# Patient Record
Sex: Female | Born: 1955 | Race: White | Hispanic: No | Marital: Married | Smoking: Current every day smoker
Health system: Southern US, Community
[De-identification: ages and names within clinical notes are randomized; demographics above are authoritative.]

## PROBLEM LIST (undated history)

## (undated) DIAGNOSIS — E785 Hyperlipidemia, unspecified: Secondary | ICD-10-CM

## (undated) DIAGNOSIS — F1010 Alcohol abuse, uncomplicated: Secondary | ICD-10-CM

## (undated) DIAGNOSIS — I4719 Other supraventricular tachycardia: Secondary | ICD-10-CM

## (undated) DIAGNOSIS — Z720 Tobacco use: Secondary | ICD-10-CM

## (undated) DIAGNOSIS — M109 Gout, unspecified: Secondary | ICD-10-CM

## (undated) DIAGNOSIS — I2510 Atherosclerotic heart disease of native coronary artery without angina pectoris: Secondary | ICD-10-CM

## (undated) DIAGNOSIS — I471 Supraventricular tachycardia, unspecified: Secondary | ICD-10-CM

## (undated) DIAGNOSIS — M1990 Unspecified osteoarthritis, unspecified site: Secondary | ICD-10-CM

## (undated) DIAGNOSIS — R7989 Other specified abnormal findings of blood chemistry: Secondary | ICD-10-CM

## (undated) HISTORY — DX: Supraventricular tachycardia, unspecified: I47.10

## (undated) HISTORY — PX: TUBAL LIGATION: SHX77

## (undated) HISTORY — DX: Supraventricular tachycardia: I47.1

## (undated) HISTORY — DX: Tobacco use: Z72.0

## (undated) HISTORY — DX: Other specified abnormal findings of blood chemistry: R79.89

## (undated) HISTORY — DX: Other supraventricular tachycardia: I47.19

## (undated) HISTORY — DX: Atherosclerotic heart disease of native coronary artery without angina pectoris: I25.10

## (undated) HISTORY — DX: Hyperlipidemia, unspecified: E78.5

## (undated) HISTORY — DX: Alcohol abuse, uncomplicated: F10.10

---

## 2004-09-13 ENCOUNTER — Emergency Department: Payer: Self-pay | Admitting: Emergency Medicine

## 2006-07-07 ENCOUNTER — Emergency Department: Payer: Self-pay | Admitting: Emergency Medicine

## 2009-09-07 ENCOUNTER — Ambulatory Visit: Payer: Self-pay | Admitting: Family Medicine

## 2009-09-07 DIAGNOSIS — Z860100 Personal history of colon polyps, unspecified: Secondary | ICD-10-CM | POA: Insufficient documentation

## 2009-09-07 DIAGNOSIS — M129 Arthropathy, unspecified: Secondary | ICD-10-CM | POA: Insufficient documentation

## 2009-09-07 DIAGNOSIS — K921 Melena: Secondary | ICD-10-CM

## 2009-09-07 DIAGNOSIS — M109 Gout, unspecified: Secondary | ICD-10-CM

## 2009-09-07 DIAGNOSIS — R1031 Right lower quadrant pain: Secondary | ICD-10-CM

## 2009-09-10 ENCOUNTER — Ambulatory Visit: Payer: Self-pay | Admitting: Family Medicine

## 2009-09-10 DIAGNOSIS — E785 Hyperlipidemia, unspecified: Secondary | ICD-10-CM | POA: Insufficient documentation

## 2009-09-10 LAB — CONVERTED CEMR LAB
AST: 28 units/L (ref 0–37)
Albumin: 4.1 g/dL (ref 3.5–5.2)
Alkaline Phosphatase: 101 units/L (ref 39–117)
Chloride: 104 meq/L (ref 96–112)
Cholesterol: 254 mg/dL — ABNORMAL HIGH (ref 0–200)
Eosinophils Relative: 1.9 % (ref 0.0–5.0)
GFR calc non Af Amer: 80.42 mL/min (ref >60)
Glucose, Bld: 110 mg/dL — ABNORMAL HIGH (ref 70–99)
HCT: 42.5 % (ref 36.0–46.0)
HDL: 47.4 mg/dL (ref >39.00)
Hemoglobin: 14.8 g/dL (ref 12.0–15.0)
Lymphs Abs: 3.5 10*3/uL (ref 0.7–4.0)
MCV: 93.9 fL (ref 78.0–100.0)
Monocytes Relative: 8.8 % (ref 3.0–12.0)
Neutro Abs: 8.5 10*3/uL — ABNORMAL HIGH (ref 1.4–7.7)
Potassium: 3.8 meq/L (ref 3.5–5.1)
RDW: 12.5 % (ref 11.5–14.6)
Rhuematoid fact SerPl-aCnc: <20 intl units/mL (ref 0.0–20.0)
Total CHOL/HDL Ratio: 5
Total Protein: 7.5 g/dL (ref 6.0–8.3)
Triglycerides: 431 mg/dL — ABNORMAL HIGH (ref 0.0–149.0)
WBC: 13.6 10*3/uL — ABNORMAL HIGH (ref 4.5–10.5)

## 2009-09-21 ENCOUNTER — Telehealth: Payer: Self-pay | Admitting: Family Medicine

## 2009-09-24 ENCOUNTER — Other Ambulatory Visit: Admission: RE | Admit: 2009-09-24 | Discharge: 2009-09-24 | Payer: Self-pay | Admitting: Family Medicine

## 2009-09-24 ENCOUNTER — Encounter: Payer: Self-pay | Admitting: Family Medicine

## 2009-09-24 ENCOUNTER — Ambulatory Visit: Payer: Self-pay | Admitting: Family Medicine

## 2009-09-25 LAB — CONVERTED CEMR LAB
Albumin: 4.2 g/dL (ref 3.5–5.2)
Cholesterol: 249 mg/dL — ABNORMAL HIGH (ref 0–200)
Total Bilirubin: 0.7 mg/dL (ref 0.3–1.2)
Total CHOL/HDL Ratio: 5
Triglycerides: 311 mg/dL — ABNORMAL HIGH (ref 0.0–149.0)
VLDL: 62.2 mg/dL — ABNORMAL HIGH (ref 0.0–40.0)

## 2009-09-26 ENCOUNTER — Encounter (INDEPENDENT_AMBULATORY_CARE_PROVIDER_SITE_OTHER): Payer: Self-pay | Admitting: *Deleted

## 2009-09-27 ENCOUNTER — Encounter (INDEPENDENT_AMBULATORY_CARE_PROVIDER_SITE_OTHER): Payer: Self-pay | Admitting: *Deleted

## 2009-10-15 ENCOUNTER — Telehealth: Payer: Self-pay | Admitting: Family Medicine

## 2009-11-02 ENCOUNTER — Telehealth: Payer: Self-pay | Admitting: Family Medicine

## 2009-11-19 ENCOUNTER — Telehealth: Payer: Self-pay | Admitting: Family Medicine

## 2009-11-19 ENCOUNTER — Encounter: Admission: RE | Admit: 2009-11-19 | Discharge: 2009-11-19 | Payer: Self-pay | Admitting: Family Medicine

## 2009-11-20 ENCOUNTER — Ambulatory Visit: Payer: Self-pay | Admitting: Gastroenterology

## 2009-11-21 LAB — CONVERTED CEMR LAB
AST: 30 units/L (ref 0–37)
Alkaline Phosphatase: 105 units/L (ref 39–117)
BUN: 10 mg/dL (ref 6–23)
Basophils Absolute: 0.1 10*3/uL (ref 0.0–0.1)
Chloride: 104 meq/L (ref 96–112)
Eosinophils Absolute: 0.2 10*3/uL (ref 0.0–0.7)
Glucose, Bld: 98 mg/dL (ref 70–99)
Hemoglobin: 15 g/dL (ref 12.0–15.0)
Lymphocytes Relative: 21.6 % (ref 12.0–46.0)
MCHC: 33.7 g/dL (ref 30.0–36.0)
Neutrophils Relative %: 69.5 % (ref 43.0–77.0)
Platelets: 354 10*3/uL (ref 150.0–400.0)
Potassium: 5 meq/L (ref 3.5–5.1)
RBC: 4.7 M/uL (ref 3.87–5.11)
RDW: 12.4 % (ref 11.5–14.6)
Sed Rate: 17 mm/hr (ref 0–22)
Sodium: 140 meq/L (ref 135–145)
Total Bilirubin: 0.7 mg/dL (ref 0.3–1.2)

## 2009-12-04 ENCOUNTER — Encounter: Payer: Self-pay | Admitting: Family Medicine

## 2009-12-05 ENCOUNTER — Telehealth: Payer: Self-pay | Admitting: Family Medicine

## 2009-12-07 ENCOUNTER — Ambulatory Visit: Payer: Self-pay | Admitting: Gastroenterology

## 2009-12-11 ENCOUNTER — Encounter: Payer: Self-pay | Admitting: Gastroenterology

## 2009-12-19 ENCOUNTER — Telehealth: Payer: Self-pay | Admitting: Family Medicine

## 2009-12-19 ENCOUNTER — Encounter: Admission: RE | Admit: 2009-12-19 | Discharge: 2009-12-19 | Payer: Self-pay | Admitting: Family Medicine

## 2009-12-20 DIAGNOSIS — N63 Unspecified lump in unspecified breast: Secondary | ICD-10-CM

## 2010-01-03 ENCOUNTER — Telehealth: Payer: Self-pay | Admitting: Family Medicine

## 2010-01-08 ENCOUNTER — Ambulatory Visit: Payer: Self-pay | Admitting: Family Medicine

## 2010-01-10 LAB — CONVERTED CEMR LAB
AST: 27 units/L (ref 0–37)
Direct LDL: 154 mg/dL
HDL: 54.3 mg/dL (ref >39.00)
Total CHOL/HDL Ratio: 4
VLDL: 68 mg/dL — ABNORMAL HIGH (ref 0.0–40.0)

## 2010-01-24 ENCOUNTER — Telehealth: Payer: Self-pay | Admitting: Family Medicine

## 2010-02-14 ENCOUNTER — Telehealth: Payer: Self-pay | Admitting: Family Medicine

## 2010-03-04 ENCOUNTER — Telehealth: Payer: Self-pay | Admitting: Family Medicine

## 2010-03-25 ENCOUNTER — Telehealth: Payer: Self-pay | Admitting: Family Medicine

## 2010-04-29 ENCOUNTER — Telehealth: Payer: Self-pay | Admitting: Family Medicine

## 2010-05-21 ENCOUNTER — Telehealth: Payer: Self-pay | Admitting: Family Medicine

## 2010-06-10 ENCOUNTER — Telehealth: Payer: Self-pay | Admitting: Family Medicine

## 2010-06-27 ENCOUNTER — Telehealth: Payer: Self-pay | Admitting: Family Medicine

## 2010-07-29 ENCOUNTER — Telehealth: Payer: Self-pay | Admitting: Family Medicine

## 2010-08-26 ENCOUNTER — Telehealth: Payer: Self-pay | Admitting: Family Medicine

## 2010-09-17 ENCOUNTER — Telehealth: Payer: Self-pay | Admitting: Family Medicine

## 2010-09-30 ENCOUNTER — Telehealth: Payer: Self-pay | Admitting: Family Medicine

## 2010-12-08 ENCOUNTER — Encounter: Payer: Self-pay | Admitting: Family Medicine

## 2010-12-11 ENCOUNTER — Telehealth: Payer: Self-pay | Admitting: Family Medicine

## 2010-12-17 NOTE — Progress Notes (Signed)
Summary: Hydrocodone/APAP  Phone Note Refill Request Message from:  Fax from Pharmacy on November 19, 2009 4:44 PM  Refills Requested: Medication #1:  HYDROCODONE-ACETAMINOPHEN 5-325 MG TABS 1 by mouth up to 4 times per day as needed for pain [facility]  Phone:   [phone]   Method Requested: Telephone to Pharmacy Initial call taken by: Delilah Shan CMA (AAMA),  November 19, 2009 4:44 PM  Follow-up for Phone Call        OK to call in 30 tabs, no refills. Follow-up by: Ruthe Mannan MD,  November 20, 2009 6:53 AM  Additional Follow-up for Phone Call Additional follow up Details #1::        Medication phoned to pharmacy.  Additional Follow-up by: Delilah Shan CMA (AAMA),  November 20, 2009 8:19 AM    Prescriptions: HYDROCODONE-ACETAMINOPHEN 5-325 MG TABS (HYDROCODONE-ACETAMINOPHEN) 1 by mouth up to 4 times per day as needed for pain  #30 x 0   Entered by:   Delilah Shan CMA (AAMA)   Authorized by:   Ruthe Mannan MD   Signed by:   Delilah Shan CMA (AAMA) on 11/20/2009   Method used:   Handwritten   RxID:   [id number]

## 2010-12-17 NOTE — Progress Notes (Signed)
Summary: refill request for vicodin  Phone Note Refill Request Message from:  Fax from Pharmacy  Refills Requested: Medication #1:  HYDROCODONE-ACETAMINOPHEN 5-325 MG TABS 1 by mouth up to 4 times per day as needed for pain   Last Refilled: 01/24/2010 Faxed request from [place], [phone].  Initial call taken by: Lowella Petties CMA,  February 14, 2010 9:22 AM  Follow-up for Phone Call        Medication phoned to pharmacy.  Follow-up by: Delilah Shan CMA Duncan Dull),  February 14, 2010 11:15 AM    Prescriptions: HYDROCODONE-ACETAMINOPHEN 5-325 MG TABS (HYDROCODONE-ACETAMINOPHEN) 1 by mouth up to 4 times per day as needed for pain  #30 x 0   Entered and Authorized by:   Ruthe Mannan MD   Signed by:   Ruthe Mannan MD on 02/14/2010   Method used:   Handwritten   RxID:   [id number]

## 2010-12-17 NOTE — Progress Notes (Signed)
Summary: Rx Hydrocodone  Phone Note Refill Request Call back at [phone] Message from:  [facility] on January 03, 2010 3:31 PM  Refills Requested: Medication #1:  HYDROCODONE-ACETAMINOPHEN 5-325 MG TABS 1 by mouth up to 4 times per day as needed for pain   Last Refilled: 12/19/2009 Received faxed refill request, please advise.   Method Requested: Telephone to Pharmacy Initial call taken by: Linde Gillis CMA Duncan Dull),  January 03, 2010 3:31 PM  Follow-up for Phone Call        On my desk. Follow-up by: Ruthe Mannan MD,  January 03, 2010 3:36 PM  Additional Follow-up for Phone Call Additional follow up Details #1::        Medication phoned to pharmacy.  Additional Follow-up by: Delilah Shan CMA Duncan Dull),  January 03, 2010 3:38 PM    Prescriptions: HYDROCODONE-ACETAMINOPHEN 5-325 MG TABS (HYDROCODONE-ACETAMINOPHEN) 1 by mouth up to 4 times per day as needed for pain  #30 x 0   Entered and Authorized by:   Ruthe Mannan MD   Signed by:   Ruthe Mannan MD on 01/03/2010   Method used:   Print then Give to Patient   RxID:   [id number]

## 2010-12-17 NOTE — Progress Notes (Signed)
Summary: Rx Hydrocodone/APAP  Phone Note Refill Request Call back at [phone] Message from:  [facility] on August 26, 2010 9:24 AM  Refills Requested: Medication #1:  HYDROCODONE-ACETAMINOPHEN 5-325 MG TABS 1 by mouth up to 4 times per day as needed for pain   Last Refilled: 07/29/2010 Received faxed refill request please advise.   Method Requested: Telephone to Pharmacy Initial call taken by: Linde Gillis CMA Duncan Dull),  August 26, 2010 9:25 AM  Follow-up for Phone Call        Rx called to pharmacy Follow-up by: Linde Gillis CMA Duncan Dull),  August 26, 2010 10:55 AM    Prescriptions: HYDROCODONE-ACETAMINOPHEN 5-325 MG TABS (HYDROCODONE-ACETAMINOPHEN) 1 by mouth up to 4 times per day as needed for pain  #60 x 0   Entered and Authorized by:   Ruthe Mannan MD   Signed by:   Ruthe Mannan MD on 08/26/2010   Method used:   Telephoned to ...       [facility]* (retail)       [address]       [place], [place]  [address]       Ph: [phone]       Fax: [phone]   RxID:   [id number]

## 2010-12-17 NOTE — Letter (Signed)
Summary: [facility] Instructions  [facility]  [address] [place], [place] [address]   Phone: [phone]  Fax: [phone]       JEREMIE ABDELAZIZ    December 23, 1955    MRN: [id number]        Procedure Day /Date:11/28/2009     Arrival Time:830 am     Procedure Time:930 am     Location of Procedure:                    [facility] ([facility]th [facility])   PREPARATION FOR COLONOSCOPY WITH MOVIPREP   Starting 5 days prior to your procedure 11/23/2009 do not eat nuts, seeds, popcorn, corn, beans, peas,  salads, or any raw vegetables.  Do not take any fiber supplements (e.g. Metamucil, Citrucel, and Benefiber).  THE DAY BEFORE YOUR PROCEDURE         DATE: 11/27/2009  DAY: TUE  1.  Drink clear liquids the entire day-NO SOLID FOOD  2.  Do not drink anything colored red or purple.  Avoid juices with pulp.  No orange juice.  3.  Drink at least 64 oz. (8 glasses) of fluid/clear liquids during the day to prevent dehydration and help the prep work efficiently.  CLEAR LIQUIDS INCLUDE: Water Jello Ice Popsicles Tea (sugar ok, no milk/cream) Powdered fruit flavored drinks Coffee (sugar ok, no milk/cream) Gatorade Juice: apple, white grape, white cranberry  Lemonade Clear bullion, consomm, broth Carbonated beverages (any kind) Strained chicken noodle soup Hard Candy                             4.  In the morning, mix first dose of MoviPrep solution:    Empty 1 Pouch A and 1 Pouch B into the disposable container    Add lukewarm drinking water to the top line of the container. Mix to dissolve    Refrigerate (mixed solution should be used within 24 hrs)  5.  Begin drinking the prep at 5:00 p.m. The MoviPrep container is divided by 4 marks.   Every 15 minutes drink the solution down to the next mark (approximately 8 oz) until the full liter is complete.   6.  Follow completed prep with 16 oz of clear liquid of your choice (Nothing red or purple).  Continue to drink  clear liquids until bedtime.  7.  Before going to bed, mix second dose of MoviPrep solution:    Empty 1 Pouch A and 1 Pouch B into the disposable container    Add lukewarm drinking water to the top line of the container. Mix to dissolve    Refrigerate  THE DAY OF YOUR PROCEDURE      DATE: 11/28/09 DAY: WED  Beginning at 430 a.m. (5 hours before procedure):         1. Every 15 minutes, drink the solution down to the next mark (approx 8 oz) until the full liter is complete.  2. Follow completed prep with 16 oz. of clear liquid of your choice.    3. You may drink clear liquids until 730 am (2 HOURS BEFORE PROCEDURE).   MEDICATION INSTRUCTIONS  Unless otherwise instructed, you should take regular prescription medications with a small sip of water   as early as possible the morning of your procedure.  Diabetic patients - see separate instructions.           OTHER INSTRUCTIONS  You will need a  responsible adult at least 53 years of age to accompany you and drive you home.   This person must remain in the waiting room during your procedure.  Wear loose fitting clothing that is easily removed.  Leave jewelry and other valuables at home.  However, you may wish to bring a book to read or  an iPod/MP3 player to listen to music as you wait for your procedure to start.  Remove all body piercing jewelry and leave at home.  Total time from sign-in until discharge is approximately 2-3 hours.  You should go home directly after your procedure and rest.  You can resume normal activities the  day after your procedure.  The day of your procedure you should not:   Drive   Make legal decisions   Operate machinery   Drink alcohol   Return to work  You will receive specific instructions about eating, activities and medications before you leave.    The above instructions have been reviewed and explained to me by   _______________________    I fully understand and can  verbalize these instructions _____________________________ Date _________

## 2010-12-17 NOTE — Progress Notes (Signed)
Summary: refill request for vicodin  Phone Note Refill Request Message from:  Fax from Pharmacy  Refills Requested: Medication #1:  HYDROCODONE-ACETAMINOPHEN 5-325 MG TABS 1 by mouth up to 4 times per day as needed for pain   Last Refilled: 02/14/2010 Faxed request from [place], [phone].  Initial call taken by: Lowella Petties CMA,  March 04, 2010 4:05 PM  Follow-up for Phone Call        Rx called to pharmacy Follow-up by: Benny Lennert CMA [facility] Dull),  March 05, 2010 10:50 AM    Prescriptions: HYDROCODONE-ACETAMINOPHEN 5-325 MG TABS (HYDROCODONE-ACETAMINOPHEN) 1 by mouth up to 4 times per day as needed for pain  #30 x 0   Entered and Authorized by:   Ruthe Mannan MD   Signed by:   Ruthe Mannan MD on 03/04/2010   Method used:   Handwritten   RxID:   [id number]

## 2010-12-17 NOTE — Progress Notes (Signed)
Summary: refill request for vicodin  Phone Note Refill Request Message from:  Fax from Pharmacy  Refills Requested: Medication #1:  HYDROCODONE-ACETAMINOPHEN 5-325 MG TABS 1 by mouth up to 4 times per day as needed for pain   Last Refilled: 06/27/2010 Faxed request from [place], [phone].  Initial call taken by: Lowella Petties CMA,  July 29, 2010 2:27 PM  Follow-up for Phone Call        Rx called to pharmacy Follow-up by: Linde Gillis CMA Duncan Dull),  July 29, 2010 3:22 PM    Prescriptions: HYDROCODONE-ACETAMINOPHEN 5-325 MG TABS (HYDROCODONE-ACETAMINOPHEN) 1 by mouth up to 4 times per day as needed for pain  #60 x 0   Entered and Authorized by:   Ruthe Mannan MD   Signed by:   Ruthe Mannan MD on 07/29/2010   Method used:   Telephoned to ...       [facility]* (retail)       [address]       [place], [place]  [address]       Ph: [phone]       Fax: [phone]   RxID:   [id number]

## 2010-12-17 NOTE — Progress Notes (Signed)
Summary: [facility]  Phone Note Refill Request Message from:  Fax from Pharmacy  Refills Requested: Medication #1:  HYDROCODONE-ACETAMINOPHEN 5-325 MG TABS 1 by mouth up to 4 times per day as needed for pain   Last Refilled: 03/05/2010 Faxed request from [place], [phone].  Initial call taken by: Lowella Petties CMA,  Mar 25, 2010 3:36 PM  Follow-up for Phone Call        ok to give 60 with no refills. Ruthe Mannan MD  Mar 25, 2010 3:46 PM Medication phoned to pharmacy. Lugene Fuquay CMA Duncan Dull)  Mar 25, 2010 3:53 PM

## 2010-12-17 NOTE — Procedures (Signed)
Summary: Colonoscopy  Patient: [name] Note: All result statuses are Final unless otherwise noted.  Tests: (1) Colonoscopy (COL)   COL Colonoscopy           DONE     [facility]     [address]     [place], [place]  [address]           COLONOSCOPY PROCEDURE REPORT           PATIENT:  Jennifer Pennington, Jennifer Pennington  MR#:  [id number]     BIRTHDATE:  1956/10/06, 53 yrs. old  GENDER:  female           ENDOSCOPIST:  Rachael Fee, MD     Referred by:  Ruthe Mannan, M.D.           PROCEDURE DATE:  12/07/2009     PROCEDURE:  Colonoscopy with biopsy     ASA CLASS:  Class II     INDICATIONS:  intermittent rectal bleeding (not anemic)           MEDICATIONS:   Fentanyl 125 mcg IV, Versed 12 mg IV           DESCRIPTION OF PROCEDURE:   After the risks benefits and     alternatives of the procedure were thoroughly explained, informed     consent was obtained.  Digital rectal exam was performed and     revealed no rectal masses.   The LB [id number]-[id number] endoscope     was introduced through the anus and advanced to the cecum, which     was identified by both the appendix and ileocecal valve, without     limitations.  The quality of the prep was good, using MoviPrep.     The instrument was then slowly withdrawn as the colon was fully     examined.     <<PROCEDUREIMAGES>>           FINDINGS:  Moderate diverticulosis was found sigmoid to descending     colon segments.  There was minor associated edema throughout these     segments (see image1).  Internal and external hemorrhoids were     found. These were medium sized, not thrombosed (see image4).  This     was otherwise a normal examination of the colon (see image3 and     image2).   Random colon biopsies were taken and sent to pathology     (jar 1).  Retroflexed views in the rectum revealed no     abnormalities.    The scope was then withdrawn from the patient     and the procedure completed.           COMPLICATIONS:  None        ENDOSCOPIC IMPRESSION:     1) Moderate diverticulosis in the sigmoid to descending colon     segments     2) Internal and external hemorrhoids, medium sized     3) Otherwise normal examination; random colon biopsies were     taken to check for microscopic colitis (can cause chronic loose     stools, diarrhea)           RECOMMENDATIONS:     1) Continue current colorectal screening recommendations for     "routine risk" patients with a repeat colonoscopy in 10 years.     2) Hemorrhoids are almost certainly causing the intermittent     rectal bleeding.  She should use OTC preparation H as needed twice  daily when the bleeding symptoms flare up.  If this is not     helpful, call Dr. Christella Hartigan office for prescription strength meds.     3) If biopsies from colon suggest microscopic colitis, then she will     start appropriate meds for this. If not, will advise once daily,     scheduled immodium.           REPEAT EXAM:  10 years           ______________________________     Rachael Fee, MD           n.     eSIGNED:   Rachael Fee at 12/07/2009 10:37 AM           Delorse Limber, [phone]  Note: An exclamation mark (!) indicates a result that was not dispersed into the flowsheet. Document Creation Date: 12/07/2009 10:36 AM _______________________________________________________________________  (1) Order result status: Final Collection or observation date-time: 12/07/2009 10:28 Requested date-time:  Receipt date-time:  Reported date-time:  Referring Physician:   Ordering Physician: Rob Bunting [id number]) Specimen Source:  Source: Launa Grill Order Number: [id number] Lab site:   Appended Document: Colonoscopy     Procedures Next Due Date:    Colonoscopy: 12/2019

## 2010-12-17 NOTE — Progress Notes (Signed)
Summary: Rx Hydrocodone/APAP  Phone Note Refill Request Call back at [phone] Message from:  [facility] on May 21, 2010 2:21 PM  Refills Requested: Medication #1:  HYDROCODONE-ACETAMINOPHEN 5-325 MG TABS 1 by mouth up to 4 times per day as needed for pain   Last Refilled: 04/29/2010 Received faxed refill request please advise.   Method Requested: Telephone to Pharmacy Initial call taken by: Linde Gillis CMA Duncan Dull),  May 21, 2010 2:23 PM  Follow-up for Phone Call        Rx called to pharmacy Follow-up by: Linde Gillis CMA Duncan Dull),  May 21, 2010 3:20 PM    Prescriptions: HYDROCODONE-ACETAMINOPHEN 5-325 MG TABS (HYDROCODONE-ACETAMINOPHEN) 1 by mouth up to 4 times per day as needed for pain  #30 x 0   Entered and Authorized by:   Ruthe Mannan MD   Signed by:   Ruthe Mannan MD on 05/21/2010   Method used:   Telephoned to ...       [facility]* (retail)       [address]       [place], [place]  [address]       Ph: [phone]       Fax: [phone]   RxID:   [id number]

## 2010-12-17 NOTE — Progress Notes (Signed)
Summary: Rx Hydrocodone  Phone Note Refill Request Call back at [phone] Message from:  [facility] on January 24, 2010 11:54 AM  Refills Requested: Medication #1:  HYDROCODONE-ACETAMINOPHEN 5-325 MG TABS 1 by mouth up to 4 times per day as needed for pain   Last Refilled: 01/03/2010 Received faxed refill request, please advise.   Method Requested: Telephone to Pharmacy Initial call taken by: Linde Gillis CMA Duncan Dull),  January 24, 2010 11:54 AM  Follow-up for Phone Call        On my desk. Follow-up by: Ruthe Mannan MD,  January 24, 2010 12:05 PM  Additional Follow-up for Phone Call Additional follow up Details #1::        Medication phoned to pharmacy.   Additional Follow-up by: Delilah Shan CMA Duncan Dull),  January 24, 2010 12:26 PM    Prescriptions: HYDROCODONE-ACETAMINOPHEN 5-325 MG TABS (HYDROCODONE-ACETAMINOPHEN) 1 by mouth up to 4 times per day as needed for pain  #30 x 0   Entered and Authorized by:   Ruthe Mannan MD   Signed by:   Ruthe Mannan MD on 01/24/2010   Method used:   Print then Give to Patient   RxID:   [id number]

## 2010-12-17 NOTE — Progress Notes (Signed)
Summary: pt is changing pharmacies  Phone Note Refill Request Message from:  Fax from Pharmacy  Refills Requested: Medication #1:  HYDROCODONE-ACETAMINOPHEN 5-325 MG TABS 1 by mouth up to 4 times per day as needed for pain  Medication #2:  PRAVASTATIN SODIUM 10 MG  TABS one by mouth at bedtime. Pt has transferred her refills to [facility] in [place], forms are on your desk.  Initial call taken by: Lowella Petties CMA, AAMA,  September 30, 2010 12:49 PM  Follow-up for Phone Call        on my desk. Ruthe Mannan MD  October 01, 2010 7:27 AM Forms faxed.         Lowella Petties CMA, AAMA  October 01, 2010 8:01 AM

## 2010-12-17 NOTE — Progress Notes (Signed)
Summary: refill request for vicodin  Phone Note Refill Request Message from:  Fax from Pharmacy  Refills Requested: Medication #1:  HYDROCODONE-ACETAMINOPHEN 5-325 MG TABS 1 by mouth up to 4 times per day as needed for pain   Last Refilled: 03/25/2010 Faxed request from [place], [phone].  Initial call taken by: Lowella Petties CMA,  April 29, 2010 12:45 PM  Follow-up for Phone Call        Rx called to pharmacy Follow-up by: Linde Gillis CMA Duncan Dull),  April 29, 2010 2:26 PM    Prescriptions: HYDROCODONE-ACETAMINOPHEN 5-325 MG TABS (HYDROCODONE-ACETAMINOPHEN) 1 by mouth up to 4 times per day as needed for pain  #30 x 0   Entered and Authorized by:   Ruthe Mannan MD   Signed by:   Ruthe Mannan MD on 04/29/2010   Method used:   Telephoned to ...       [facility]* (retail)       [address]       [place], [place]  [address]       Ph: [phone]       Fax: [phone]   RxID:   [id number]

## 2010-12-17 NOTE — Progress Notes (Signed)
Summary: Rx Hydrocodone/APAP  Phone Note Refill Request Call back at [phone] Message from:  [facility] on December 05, 2009 12:08 PM  Refills Requested: Medication #1:  HYDROCODONE-ACETAMINOPHEN 5-325 MG TABS 1 by mouth up to 4 times per day as needed for pain   Last Refilled: 11/20/2009 Received faxed refill request, please advise   Method Requested: Telephone to Pharmacy Initial call taken by: Linde Gillis CMA Duncan Dull),  December 05, 2009 12:10 PM  Follow-up for Phone Call        Medication phoned to pharmacy.  Follow-up by: Delilah Shan CMA Duncan Dull),  December 05, 2009 2:38 PM    Prescriptions: HYDROCODONE-ACETAMINOPHEN 5-325 MG TABS (HYDROCODONE-ACETAMINOPHEN) 1 by mouth up to 4 times per day as needed for pain  #30 x 0   Entered and Authorized by:   Ruthe Mannan MD   Signed by:   Ruthe Mannan MD on 12/05/2009   Method used:   Handwritten   RxID:   [id number]

## 2010-12-17 NOTE — Letter (Signed)
Summary: Results Letter  [facility]  [address] [place], [place] [address]   Phone: [phone]  Fax: [phone]        December 11, 2009 MRN: [id number]    Jennifer Pennington [address] [place], [place]  [address]    Dear Ms. Wimes,   The biopsies taken during your recent procedure colonoscopy showed no sign of inflammation or infection that could account for your chronic loose stools.  Please start one over the counter immodium every morning shortly after you wake up to help slow your bowels down.  If this is not helpful, add a second pill later in the morning or early afternoon.  If you are still have difficulty, please call my office to schedule a return appointment.  You should continue to follow current colorectal cancer screening guidelines with a repeat colonoscopy in 10 years.  We will therefore put your information in our reminder system and will contact you in 10 years to schedule a repeat procedure.  Please call if you have any questions or concerns.       Sincerely,  Rachael Fee MD  This letter has been electronically signed by your physician.  Appended Document: Results Letter Letter mailed 1.27.11

## 2010-12-17 NOTE — Progress Notes (Signed)
Summary: refill request for vicodin  Phone Note Refill Request Message from:  Fax from Pharmacy  Refills Requested: Medication #1:  HYDROCODONE-ACETAMINOPHEN 5-325 MG TABS 1 by mouth up to 4 times per day as needed for pain   Last Refilled: 05/21/2010 Faxed request from [place], [phone].   Initial call taken by: Lowella Petties CMA,  June 10, 2010 8:53 AM  Follow-up for Phone Call        Rx called to pharmacy Follow-up by: Linde Gillis CMA Duncan Dull),  June 10, 2010 9:15 AM    Prescriptions: HYDROCODONE-ACETAMINOPHEN 5-325 MG TABS (HYDROCODONE-ACETAMINOPHEN) 1 by mouth up to 4 times per day as needed for pain  #30 x 0   Entered and Authorized by:   Ruthe Mannan MD   Signed by:   Ruthe Mannan MD on 06/10/2010   Method used:   Telephoned to ...       [facility]* (retail)       [address]       [place], [place]  [address]       Ph: [phone]       Fax: [phone]   RxID:   [id number]

## 2010-12-17 NOTE — Assessment & Plan Note (Signed)
History of Present Illness Visit Type: consult  Primary GI MD: Rob Bunting MD Primary Provider: Ruthe Mannan, MD Requesting Provider: Ruthe Mannan, MD Chief Complaint: BRB in stool and when pt wipes after BMs  History of Present Illness:     very pleasant 55 year old woman who noted red blood in her stool for the past 3-4 years.  Last time was about 1 month ago.  She has no anal discomfort around those times.  The bleeding will last 2-7 days, no changes in BMs otherwise.  often times the bleeding will be preceded by low, left pelvic discomfort.  she tells me that she had a colonoscopy about 20 years ago and that polyps were removed at that time. She has not had repeat colonoscopy since then.  her weight is up a bout 15 pounds in past couple years.           Current Medications (verified): 1)  Ibuprofen 200 Mg Tabs (Ibuprofen) .... Otc    Takes 15 - 18 Per Day For Body Aches. 2)  Multivitamins   Tabs (Multiple Vitamin) .... Take 1 Tablet By Mouth Once A Day 3)  Meloxicam 15 Mg Tabs (Meloxicam) .Marland Kitchen.. 1 Tab By Mouth Daily. 4)  Hydrocodone-Acetaminophen 5-325 Mg Tabs (Hydrocodone-Acetaminophen) .Marland Kitchen.. 1 By Mouth Up To 4 Times Per Day As Needed For Pain 5)  Pravastatin Sodium 10 Mg  Tabs (Pravastatin Sodium) .... One By Mouth At Bedtime  Allergies (verified): No Known Drug Allergies  Past History:  Past Medical History: colon polyps, unclear pathology, 1990 Gout G3P3  Past Surgical History: Tubal ligation   Family History: Family History Diabetes 1st degree relative Family History High cholesterol Family History Hypertension grandfather had pancreatic cancer  Social History: Lives with husband and 3 dogs.  Has three adult children that live in the area.  Works in Clinical research associate at [employer].  Smokes 1 1/2 ppd x 35 years.  Drinks 2 beers/day.   Review of Systems       Pertinent positive and negative review of systems were noted in the above HPI and GI specific review of systems.  All  other review of systems was otherwise negative.   Vital Signs:  Patient profile:   55 year old female Height:      61.5 inches Weight:      155 pounds BMI:     28.92 BSA:     1.71 Pulse rate:   86 / minute Pulse rhythm:   regular BP sitting:   120 / 76  (left arm) Cuff size:   regular  Vitals Entered By: Ok Anis CMA (November 20, 2009 2:00 PM)  Physical Exam  Additional Exam:  Constitutional: generally well appearing Psychiatric: alert and oriented times 3 Eyes: extraocular movements intact Mouth: oropharynx moist, no lesions Neck: supple, no lymphadenopathy Cardiovascular: heart regular rate and rythm Lungs: CTA bilaterally Abdomen: soft, non-tender, non-distended, no obvious ascites, no peritoneal signs, normal bowel sounds Extremities: no lower extremity edema bilaterally Skin: no lesions on visible extremities    Impression & Recommendations:  Problem # 1:  Intermittent rectal bleeding, intermittent left lower quadrant discomfort this may be IBD related. I did not mention above but she does generally have 4-5 BMs a day. This is been her pattern for many years. They are not usually diarrheal stools however. She should undergo colonoscopy at her soonest convenience. We will also arrange for CBC, complete metabolic profile to be done.  Other Orders: TLB-CBC Platelet - w/Differential (85025-CBCD) TLB-CMP (Comprehensive Metabolic  Pnl) (80053-COMP) TLB-Sedimentation Rate (ESR) (85652-ESR)  Patient Instructions: 1)  You will be scheduled to have a colonoscopy. 2)  You will get lab test(s) done today (cbc, esr, cmet). 3)  A copy of this information will be sent to Dr. Dayton Martes. 4)  The medication list was reviewed and reconciled.  All changed / newly prescribed medications were explained.  A complete medication list was provided to the patient / caregiver. 5)  The medication list was reviewed and reconciled.  All changed / newly prescribed medications were explained.  A  complete medication list was provided to the patient / caregiver.  Appended Document: Orders Update/movi    Clinical Lists Changes  Medications: Added new medication of MOVIPREP 100 GM  SOLR (PEG-KCL-NACL-NASULF-NA ASC-C) As per prep instructions. - Signed Rx of MOVIPREP 100 GM  SOLR (PEG-KCL-NACL-NASULF-NA ASC-C) As per prep instructions.;  #1 x 0;  Signed;  Entered by: Chales Abrahams CMA (AAMA);  Authorized by: Rachael Fee MD;  Method used: Electronically to [facility] [address], [place], [place]  [address], Ph: [phone], Fax: [phone] Orders: Added new Test order of Colonoscopy (Colon) - Signed    Prescriptions: MOVIPREP 100 GM  SOLR (PEG-KCL-NACL-NASULF-NA ASC-C) As per prep instructions.  #1 x 0   Entered by:   Chales Abrahams CMA (AAMA)   Authorized by:   Rachael Fee MD   Signed by:   Chales Abrahams CMA (AAMA) on 11/20/2009   Method used:   Electronically to        [facility]* (retail)       [address]       [place], [place]  [address]       Ph: [phone]       Fax: [phone]   RxID:   [id number]

## 2010-12-17 NOTE — Progress Notes (Signed)
Summary: hydrocodone   Phone Note Refill Request Message from:  Fax from Pharmacy on September 17, 2010 2:42 PM  Refills Requested: Medication #1:  HYDROCODONE-ACETAMINOPHEN 5-325 MG TABS 1 by mouth up to 4 times per day as needed for pain   Last Refilled: 08/26/2010 Refill request from [place]. [phone].   Initial call taken by: Melody Comas,  September 17, 2010 2:42 PM  Follow-up for Phone Call        Rx called to pharmacy Follow-up by: Linde Gillis CMA [facility]),  September 18, 2010 9:30 AM    Prescriptions: HYDROCODONE-ACETAMINOPHEN 5-325 MG TABS (HYDROCODONE-ACETAMINOPHEN) 1 by mouth up to 4 times per day as needed for pain  #60 x 0   Entered and Authorized by:   Ruthe Mannan MD   Signed by:   Ruthe Mannan MD on 09/17/2010   Method used:   Telephoned to ...       [facility]* (retail)       [address]       [place], [place]  [address]       Ph: [phone]       Fax: [phone]   RxID:   [id number]

## 2010-12-17 NOTE — Progress Notes (Signed)
Summary: Hydrocodone/APAP  Phone Note Refill Request Message from:  Fax from Pharmacy on December 19, 2009 9:10 AM  Refills Requested: Medication #1:  HYDROCODONE-ACETAMINOPHEN 5-325 MG TABS 1 by mouth up to 4 times per day as needed for pain [facility]  Phone:   [phone]   Method Requested: Telephone to Pharmacy Initial call taken by: Delilah Shan CMA Duncan Dull),  December 19, 2009 9:10 AM  Follow-up for Phone Call        Medication phoned to pharmacy.  Follow-up by: Delilah Shan CMA (AAMA),  December 19, 2009 10:12 AM    Prescriptions: HYDROCODONE-ACETAMINOPHEN 5-325 MG TABS (HYDROCODONE-ACETAMINOPHEN) 1 by mouth up to 4 times per day as needed for pain  #30 x 0   Entered and Authorized by:   Ruthe Mannan MD   Signed by:   Ruthe Mannan MD on 12/19/2009   Method used:   Handwritten   RxID:   [id number]

## 2010-12-17 NOTE — Progress Notes (Signed)
Summary: refill request for vicodin  Phone Note Refill Request Message from:  Fax from Pharmacy  Refills Requested: Medication #1:  HYDROCODONE-ACETAMINOPHEN 5-325 MG TABS 1 by mouth up to 4 times per day as needed for pain   Last Refilled: 06/10/2010 Faxed request from [place], [phone].  Initial call taken by: Lowella Petties CMA,  June 27, 2010 8:50 AM  Follow-up for Phone Call        Rx called to pharmacy Follow-up by: Linde Gillis CMA Duncan Dull),  June 27, 2010 9:04 AM    Prescriptions: HYDROCODONE-ACETAMINOPHEN 5-325 MG TABS (HYDROCODONE-ACETAMINOPHEN) 1 by mouth up to 4 times per day as needed for pain  #60 x 0   Entered and Authorized by:   Ruthe Mannan MD   Signed by:   Ruthe Mannan MD on 06/27/2010   Method used:   Telephoned to ...       [facility]* (retail)       [address]       [place], [place]  [address]       Ph: [phone]       Fax: [phone]   RxID:   [id number]

## 2010-12-19 NOTE — Progress Notes (Signed)
Summary: hydrocodone  Phone Note Refill Request Message from:  Fax from Pharmacy on December 11, 2010 1:12 PM  Refills Requested: Medication #1:  HYDROCODONE-ACETAMINOPHEN 5-325 MG TABS 1 by mouth up to 4 times per day as needed for pain   Last Refilled: 10/31/2010 [facility] [phone]  Initial call taken by: Melody Comas,  December 11, 2010 1:13 PM  Follow-up for Phone Call        [facility] Follow-up by: Linde Gillis CMA Duncan Dull),  December 12, 2010 8:17 AM    Prescriptions: HYDROCODONE-ACETAMINOPHEN 5-325 MG TABS (HYDROCODONE-ACETAMINOPHEN) 1 by mouth up to 4 times per day as needed for pain  #60 x 0   Entered and Authorized by:   Ruthe Mannan MD   Signed by:   Ruthe Mannan MD on 12/11/2010   Method used:   Telephoned to ...       [facility]* (retail)       [address]       [place], [place]  [address]       Ph: [phone]       Fax: [phone]   RxID:   [id number]

## 2011-01-06 ENCOUNTER — Telehealth: Payer: Self-pay | Admitting: Family Medicine

## 2011-01-14 NOTE — Progress Notes (Signed)
Summary: hydrocodone  Phone Note Refill Request Message from:  Fax from Pharmacy on January 06, 2011 4:07 PM  Refills Requested: Medication #1:  HYDROCODONE-ACETAMINOPHEN 5-325 MG TABS 1 by mouth up to 4 times per day as needed for pain   Last Refilled: 12/12/2010 [facility] [phone]  Initial call taken by: Melody Comas,  January 06, 2011 4:08 PM  Follow-up for Phone Call        [facility]. Follow-up by: Linde Gillis CMA Duncan Dull),  January 06, 2011 4:24 PM    Prescriptions: HYDROCODONE-ACETAMINOPHEN 5-325 MG TABS (HYDROCODONE-ACETAMINOPHEN) 1 by mouth up to 4 times per day as needed for pain  #60 x 0   Entered and Authorized by:   Ruthe Mannan MD   Signed by:   Ruthe Mannan MD on 01/06/2011   Method used:   Telephoned to ...       [facility]* (retail)       [address]       [place], [place]  [address]       Ph: [phone]       Fax: [phone]   RxID:   [id number]

## 2011-01-31 ENCOUNTER — Telehealth: Payer: Self-pay | Admitting: Family Medicine

## 2011-02-13 NOTE — Progress Notes (Signed)
Summary: Hydrocodone/APAP  Phone Note Refill Request Message from:  Pharmacy on January 31, 2011 5:20 PM  Refills Requested: Medication #1:  HYDROCODONE-ACETAMINOPHEN 5-325 MG TABS 1 by mouth up to 4 times per day as needed for [facility]   Phone:   [phone]    Fax:   [phone]   Method Requested: Telephone to Pharmacy Initial call taken by: Delilah Shan CMA Duncan Dull),  January 31, 2011 5:21 PM  Follow-up for Phone Call        [facility] at [phone]. Follow-up by: Linde Gillis CMA Duncan Dull),  February 03, 2011 9:08 AM    Prescriptions: HYDROCODONE-ACETAMINOPHEN 5-325 MG TABS (HYDROCODONE-ACETAMINOPHEN) 1 by mouth up to 4 times per day as needed for pain  #60 x 0   Entered by:   Linde Gillis CMA (AAMA)   Authorized by:   Ruthe Mannan MD   Signed by:   Linde Gillis CMA (AAMA) on 02/03/2011   Method used:   Print then Give to Patient   RxID:   [id number] HYDROCODONE-ACETAMINOPHEN 5-325 MG TABS (HYDROCODONE-ACETAMINOPHEN) 1 by mouth up to 4 times per day as needed for pain  #60 x 0   Entered and Authorized by:   Ruthe Mannan MD   Signed by:   Ruthe Mannan MD on 01/31/2011   Method used:   Telephoned to ...       [facility]* (retail)       [address]       [place], [place]  [address]       Ph: [phone]       Fax: [phone]   RxID:   [id number]

## 2011-03-03 ENCOUNTER — Other Ambulatory Visit: Payer: Self-pay | Admitting: *Deleted

## 2011-03-04 MED ORDER — HYDROCODONE-ACETAMINOPHEN 5-325 MG PO TABS: ORAL_TABLET | ORAL | Status: DC

## 2011-03-04 NOTE — Telephone Encounter (Signed)
Rx called to pharmacy

## 2011-04-15 ENCOUNTER — Other Ambulatory Visit: Payer: Self-pay | Admitting: *Deleted

## 2011-04-15 MED ORDER — HYDROCODONE-ACETAMINOPHEN 5-325 MG PO TABS: ORAL_TABLET | ORAL | Status: DC

## 2011-04-16 NOTE — Telephone Encounter (Signed)
[facility] called to [facility].

## 2011-06-10 ENCOUNTER — Other Ambulatory Visit: Payer: Self-pay | Admitting: *Deleted

## 2011-06-10 MED ORDER — HYDROCODONE-ACETAMINOPHEN 5-325 MG PO TABS: ORAL_TABLET | ORAL | Status: DC

## 2011-06-10 NOTE — Telephone Encounter (Signed)
Rx called to pharmacy

## 2011-07-23 ENCOUNTER — Other Ambulatory Visit: Payer: Self-pay | Admitting: *Deleted

## 2011-07-23 NOTE — Telephone Encounter (Signed)
Last OV with Dr. Dayton Martes was 09/25/2011.  Last refill 06/10/2011, please advise.

## 2011-07-24 ENCOUNTER — Encounter: Payer: Self-pay | Admitting: *Deleted

## 2011-07-24 MED ORDER — HYDROCODONE-ACETAMINOPHEN 5-325 MG PO TABS: ORAL_TABLET | ORAL | Status: DC

## 2011-07-24 NOTE — Telephone Encounter (Signed)
Rx called to [facility].  Called home number and recording says your call can't be completed as dialed, call work number and they cannot page employees to come to the phone.  Will mail patient a letter to her home address.

## 2011-07-24 NOTE — Telephone Encounter (Signed)
Will refill once, but likely for further refills she will need to make annual appt. Let pt know

## 2011-07-31 ENCOUNTER — Telehealth: Payer: Self-pay | Admitting: *Deleted

## 2011-07-31 NOTE — Telephone Encounter (Signed)
Patients home phone number is disconnected.  Unable to reach letter I mailed came back not deliverable, unable to forward.  Letter was sent to patient because she needs to schedule a follow up appt.  Please advise.

## 2011-07-31 NOTE — Telephone Encounter (Signed)
Noted and will forward to [place].

## 2011-07-31 NOTE — Telephone Encounter (Signed)
Ok we are unable to refill her medications until we can reach her. Thank you for trying. Please make a note in her chart stating NO refills will done and forward this information to [place]. Thank you.

## 2011-07-31 NOTE — Telephone Encounter (Signed)
Noted  

## 2011-08-28 ENCOUNTER — Other Ambulatory Visit: Payer: Self-pay

## 2011-08-28 MED ORDER — HYDROCODONE-ACETAMINOPHEN 5-325 MG PO TABS: ORAL_TABLET | ORAL | Status: DC

## 2011-08-28 NOTE — Telephone Encounter (Signed)
[facility] faxed refill request for Hydrocodone APAP 5-325 mg. Last filled 07/24/11.Please advise.

## 2011-08-28 NOTE — Telephone Encounter (Signed)
[facility] as instructed.

## 2011-10-02 ENCOUNTER — Other Ambulatory Visit: Payer: Self-pay

## 2011-10-02 MED ORDER — HYDROCODONE-ACETAMINOPHEN 5-325 MG PO TABS: ORAL_TABLET | ORAL | Status: DC

## 2011-10-02 NOTE — Telephone Encounter (Signed)
[facility] faxed refill request for Hydrocodone-APAP 5-325 mg. Form is on your desk in the in box.

## 2011-10-02 NOTE — Telephone Encounter (Signed)
Medication phoned to [facility] [phone] spoke with [facility] pharmacy as instructed.

## 2011-11-04 ENCOUNTER — Other Ambulatory Visit: Payer: Self-pay | Admitting: *Deleted

## 2011-11-04 MED ORDER — HYDROCODONE-ACETAMINOPHEN 5-325 MG PO TABS: ORAL_TABLET | ORAL | Status: DC

## 2011-11-04 NOTE — Telephone Encounter (Signed)
[facility] called to [facility].

## 2011-11-04 NOTE — Telephone Encounter (Signed)
Patient hasn't been seen in some time now, please advise.

## 2011-12-05 ENCOUNTER — Other Ambulatory Visit: Payer: Self-pay | Admitting: *Deleted

## 2011-12-05 MED ORDER — HYDROCODONE-ACETAMINOPHEN 5-325 MG PO TABS: ORAL_TABLET | ORAL | Status: DC

## 2011-12-05 NOTE — Telephone Encounter (Signed)
Last refill 11/04/2011

## 2011-12-08 NOTE — Telephone Encounter (Signed)
Rx called to [facility]. 

## 2012-01-06 ENCOUNTER — Other Ambulatory Visit: Payer: Self-pay | Admitting: *Deleted

## 2012-01-06 MED ORDER — HYDROCODONE-ACETAMINOPHEN 5-325 MG PO TABS: ORAL_TABLET | ORAL | Status: DC

## 2012-01-06 NOTE — Telephone Encounter (Signed)
plz phone in. 

## 2012-01-06 NOTE — Telephone Encounter (Signed)
rx called to pharmacy 

## 2012-02-04 ENCOUNTER — Other Ambulatory Visit: Payer: Self-pay | Admitting: *Deleted

## 2012-02-04 MED ORDER — HYDROCODONE-ACETAMINOPHEN 5-325 MG PO TABS: ORAL_TABLET | ORAL | Status: DC

## 2012-02-04 NOTE — Telephone Encounter (Signed)
OK to refill

## 2012-02-05 NOTE — Telephone Encounter (Signed)
Rx called in as directed.   

## 2012-03-02 ENCOUNTER — Other Ambulatory Visit: Payer: Self-pay | Admitting: *Deleted

## 2012-03-02 MED ORDER — HYDROCODONE-ACETAMINOPHEN 5-325 MG PO TABS: ORAL_TABLET | ORAL | Status: DC

## 2012-03-02 NOTE — Telephone Encounter (Signed)
Last filled 02-04-2012

## 2012-03-03 NOTE — Telephone Encounter (Signed)
Medicine called to pharmacy. 

## 2012-03-31 ENCOUNTER — Other Ambulatory Visit: Payer: Self-pay | Admitting: *Deleted

## 2012-03-31 MED ORDER — HYDROCODONE-ACETAMINOPHEN 5-325 MG PO TABS: ORAL_TABLET | ORAL | Status: DC

## 2012-03-31 NOTE — Telephone Encounter (Signed)
Ok to refill once, pt need a follow up for further refills.

## 2012-03-31 NOTE — Telephone Encounter (Signed)
[facility] [place].  This has been getting refilled regularly but pt has not been seen here since 09/24/09.

## 2012-03-31 NOTE — Telephone Encounter (Signed)
Medicine called to pharmacy with instructions of no further refills until office visit.

## 2014-08-15 ENCOUNTER — Encounter: Payer: Self-pay | Admitting: Gastroenterology

## 2022-03-10 ENCOUNTER — Emergency Department
Admission: EM | Admit: 2022-03-10 | Discharge: 2022-03-10 | Disposition: A | Discharge status: Other Acute Inpatient Hospital | Payer: BLUE CROSS/BLUE SHIELD | Source: Home / Self Care | Attending: Family Medicine | Admitting: Family Medicine

## 2022-03-10 ENCOUNTER — Emergency Department (HOSPITAL_COMMUNITY): Payer: BLUE CROSS/BLUE SHIELD

## 2022-03-10 ENCOUNTER — Inpatient Hospital Stay (HOSPITAL_COMMUNITY)
Admission: EM | Admit: 2022-03-10 | Discharge: 2022-03-12 | DRG: 311 | Disposition: A | Discharge status: Home / Self Care | Payer: BLUE CROSS/BLUE SHIELD | Attending: Internal Medicine | Admitting: Internal Medicine

## 2022-03-10 ENCOUNTER — Encounter (HOSPITAL_COMMUNITY): Payer: Self-pay

## 2022-03-10 ENCOUNTER — Other Ambulatory Visit: Payer: Self-pay

## 2022-03-10 DIAGNOSIS — F1090 Alcohol use, unspecified, uncomplicated: Secondary | ICD-10-CM | POA: Diagnosis present

## 2022-03-10 DIAGNOSIS — Z888 Allergy status to other drugs, medicaments and biological substances status: Secondary | ICD-10-CM

## 2022-03-10 DIAGNOSIS — R030 Elevated blood-pressure reading, without diagnosis of hypertension: Secondary | ICD-10-CM | POA: Diagnosis not present

## 2022-03-10 DIAGNOSIS — Z8249 Family history of ischemic heart disease and other diseases of the circulatory system: Secondary | ICD-10-CM | POA: Diagnosis not present

## 2022-03-10 DIAGNOSIS — I119 Hypertensive heart disease without heart failure: Secondary | ICD-10-CM | POA: Diagnosis present

## 2022-03-10 DIAGNOSIS — I248 Other forms of acute ischemic heart disease: Principal | ICD-10-CM | POA: Diagnosis present

## 2022-03-10 DIAGNOSIS — R079 Chest pain, unspecified: Secondary | ICD-10-CM | POA: Diagnosis present

## 2022-03-10 DIAGNOSIS — I2584 Coronary atherosclerosis due to calcified coronary lesion: Secondary | ICD-10-CM | POA: Diagnosis not present

## 2022-03-10 DIAGNOSIS — I700 Atherosclerosis of aorta: Secondary | ICD-10-CM | POA: Diagnosis present

## 2022-03-10 DIAGNOSIS — R55 Syncope and collapse: Secondary | ICD-10-CM | POA: Diagnosis present

## 2022-03-10 DIAGNOSIS — I10 Essential (primary) hypertension: Secondary | ICD-10-CM

## 2022-03-10 DIAGNOSIS — I4891 Unspecified atrial fibrillation: Secondary | ICD-10-CM

## 2022-03-10 DIAGNOSIS — Z720 Tobacco use: Secondary | ICD-10-CM | POA: Diagnosis not present

## 2022-03-10 DIAGNOSIS — R7989 Other specified abnormal findings of blood chemistry: Secondary | ICD-10-CM | POA: Diagnosis not present

## 2022-03-10 DIAGNOSIS — F17210 Nicotine dependence, cigarettes, uncomplicated: Secondary | ICD-10-CM | POA: Diagnosis present

## 2022-03-10 DIAGNOSIS — Z79899 Other long term (current) drug therapy: Secondary | ICD-10-CM | POA: Diagnosis not present

## 2022-03-10 DIAGNOSIS — R931 Abnormal findings on diagnostic imaging of heart and coronary circulation: Secondary | ICD-10-CM | POA: Diagnosis not present

## 2022-03-10 DIAGNOSIS — Z7982 Long term (current) use of aspirin: Secondary | ICD-10-CM | POA: Diagnosis not present

## 2022-03-10 DIAGNOSIS — M1990 Unspecified osteoarthritis, unspecified site: Secondary | ICD-10-CM | POA: Diagnosis present

## 2022-03-10 DIAGNOSIS — I471 Supraventricular tachycardia: Secondary | ICD-10-CM | POA: Diagnosis present

## 2022-03-10 DIAGNOSIS — M109 Gout, unspecified: Secondary | ICD-10-CM | POA: Diagnosis present

## 2022-03-10 DIAGNOSIS — F1010 Alcohol abuse, uncomplicated: Secondary | ICD-10-CM

## 2022-03-10 DIAGNOSIS — R778 Other specified abnormalities of plasma proteins: Secondary | ICD-10-CM | POA: Insufficient documentation

## 2022-03-10 DIAGNOSIS — I499 Cardiac arrhythmia, unspecified: Principal | ICD-10-CM

## 2022-03-10 DIAGNOSIS — I2510 Atherosclerotic heart disease of native coronary artery without angina pectoris: Secondary | ICD-10-CM | POA: Diagnosis present

## 2022-03-10 DIAGNOSIS — E782 Mixed hyperlipidemia: Secondary | ICD-10-CM | POA: Diagnosis not present

## 2022-03-10 DIAGNOSIS — E785 Hyperlipidemia, unspecified: Secondary | ICD-10-CM | POA: Diagnosis present

## 2022-03-10 HISTORY — DX: Gout, unspecified: M10.9

## 2022-03-10 HISTORY — DX: Unspecified osteoarthritis, unspecified site: M19.90

## 2022-03-10 LAB — D-DIMER, QUANTITATIVE: D-Dimer, Quant: 0.9 ug/mL-FEU — ABNORMAL HIGH (ref 0.00–0.50)

## 2022-03-10 LAB — CBC WITH DIFFERENTIAL/PLATELET
Abs Immature Granulocytes: 0.05 10*3/uL (ref 0.00–0.07)
Basophils Absolute: 0.1 10*3/uL (ref 0.0–0.1)
Basophils Relative: 1 %
Eosinophils Absolute: 0.1 10*3/uL (ref 0.0–0.5)
Eosinophils Relative: 1 %
HCT: 49 % — ABNORMAL HIGH (ref 36.0–46.0)
Hemoglobin: 16.4 g/dL — ABNORMAL HIGH (ref 12.0–15.0)
Immature Granulocytes: 1 %
Lymphocytes Relative: 26 %
Lymphs Abs: 2.9 10*3/uL (ref 0.7–4.0)
MCH: 31.1 pg (ref 26.0–34.0)
MCHC: 33.5 g/dL (ref 30.0–36.0)
MCV: 92.8 fL (ref 80.0–100.0)
Monocytes Absolute: 1.1 10*3/uL — ABNORMAL HIGH (ref 0.1–1.0)
Monocytes Relative: 10 %
Neutro Abs: 6.8 10*3/uL (ref 1.7–7.7)
Neutrophils Relative %: 61 %
Platelets: 270 10*3/uL (ref 150–400)
RBC: 5.28 MIL/uL — ABNORMAL HIGH (ref 3.87–5.11)
RDW: 14 % (ref 11.5–15.5)
WBC: 11 10*3/uL — ABNORMAL HIGH (ref 4.0–10.5)
nRBC: 0 % (ref 0.0–0.2)

## 2022-03-10 LAB — COMPREHENSIVE METABOLIC PANEL
ALT: 71 U/L — ABNORMAL HIGH (ref 0–44)
AST: 111 U/L — ABNORMAL HIGH (ref 15–41)
Albumin: 3.8 g/dL (ref 3.5–5.0)
Alkaline Phosphatase: 92 U/L (ref 38–126)
Anion gap: 9 (ref 5–15)
BUN: 5 mg/dL — ABNORMAL LOW (ref 8–23)
CO2: 25 mmol/L (ref 22–32)
Calcium: 9.4 mg/dL (ref 8.9–10.3)
Chloride: 100 mmol/L (ref 98–111)
Creatinine, Ser: 0.66 mg/dL (ref 0.44–1.00)
GFR, Estimated: >60 mL/min (ref >60)
Glucose, Bld: 97 mg/dL (ref 70–99)
Potassium: 3.5 mmol/L (ref 3.5–5.1)
Sodium: 134 mmol/L — ABNORMAL LOW (ref 135–145)
Total Bilirubin: 0.7 mg/dL (ref 0.3–1.2)
Total Protein: 7.5 g/dL (ref 6.5–8.1)

## 2022-03-10 LAB — TROPONIN I (HIGH SENSITIVITY)
Troponin I (High Sensitivity): 29 ng/L — ABNORMAL HIGH (ref <18)
Troponin I (High Sensitivity): 35 ng/L — ABNORMAL HIGH (ref <18)
Troponin I (High Sensitivity): 31 ng/L — ABNORMAL HIGH (ref <18)

## 2022-03-10 LAB — HEMOGLOBIN A1C
Hgb A1c MFr Bld: 5.4 % (ref 4.8–5.6)
Mean Plasma Glucose: 108.28 mg/dL

## 2022-03-10 LAB — HIV ANTIBODY (ROUTINE TESTING W REFLEX): HIV Screen 4th Generation wRfx: NONREACTIVE

## 2022-03-10 LAB — BRAIN NATRIURETIC PEPTIDE: B Natriuretic Peptide: 383.2 pg/mL — ABNORMAL HIGH (ref 0.0–100.0)

## 2022-03-10 LAB — PROTIME-INR
INR: 1 (ref 0.8–1.2)
Prothrombin Time: 12.9 seconds (ref 11.4–15.2)

## 2022-03-10 LAB — T4, FREE: Free T4: 0.91 ng/dL (ref 0.61–1.12)

## 2022-03-10 LAB — MAGNESIUM: Magnesium: 1.9 mg/dL (ref 1.7–2.4)

## 2022-03-10 LAB — TSH: TSH: 2.181 u[IU]/mL (ref 0.350–4.500)

## 2022-03-10 LAB — HEPARIN LEVEL (UNFRACTIONATED): Heparin Unfractionated: 0.2 IU/mL — ABNORMAL LOW (ref 0.30–0.70)

## 2022-03-10 MED ORDER — HEPARIN BOLUS VIA INFUSION
2700.0000 [IU] | Freq: Once | INTRAVENOUS | Status: AC
  Administered 2022-03-10: 2700 [IU] via INTRAVENOUS
  Filled 2022-03-10: qty 2700

## 2022-03-10 MED ORDER — NICOTINE POLACRILEX 2 MG MT GUM
2.0000 mg | CHEWING_GUM | OROMUCOSAL | Status: DC | PRN
  Administered 2022-03-10: 2 mg via ORAL
  Filled 2022-03-10: qty 1

## 2022-03-10 MED ORDER — ADULT MULTIVITAMIN W/MINERALS CH
1.0000 | ORAL_TABLET | Freq: Every day | ORAL | Status: DC
  Administered 2022-03-10 – 2022-03-11 (×2): 1 via ORAL
  Filled 2022-03-10 (×3): qty 1

## 2022-03-10 MED ORDER — HEPARIN (PORCINE) 25000 UT/250ML-% IV SOLN
950.0000 [IU]/h | INTRAVENOUS | Status: DC
  Administered 2022-03-10: 800 [IU]/h via INTRAVENOUS
  Administered 2022-03-11: 950 [IU]/h via INTRAVENOUS
  Filled 2022-03-10 (×2): qty 250

## 2022-03-10 MED ORDER — ATORVASTATIN CALCIUM 80 MG PO TABS
80.0000 mg | ORAL_TABLET | Freq: Every day | ORAL | Status: DC
  Administered 2022-03-10 – 2022-03-11 (×2): 80 mg via ORAL
  Filled 2022-03-10 (×2): qty 1

## 2022-03-10 MED ORDER — LORAZEPAM 1 MG PO TABS
1.0000 mg | ORAL_TABLET | ORAL | Status: DC | PRN
  Administered 2022-03-10: 1 mg via ORAL
  Filled 2022-03-10: qty 1

## 2022-03-10 MED ORDER — NITROGLYCERIN 2 % TD OINT
1.0000 [in_us] | TOPICAL_OINTMENT | Freq: Four times a day (QID) | TRANSDERMAL | Status: DC
  Administered 2022-03-11 (×2): 1 [in_us] via TOPICAL
  Filled 2022-03-10 (×2): qty 1

## 2022-03-10 MED ORDER — ASPIRIN 300 MG RE SUPP
300.0000 mg | RECTAL | Status: AC
  Filled 2022-03-10: qty 1

## 2022-03-10 MED ORDER — THIAMINE HCL 100 MG PO TABS
100.0000 mg | ORAL_TABLET | Freq: Every day | ORAL | Status: DC
  Administered 2022-03-10 – 2022-03-11 (×2): 100 mg via ORAL
  Filled 2022-03-10 (×3): qty 1

## 2022-03-10 MED ORDER — ASPIRIN 81 MG PO CHEW
324.0000 mg | CHEWABLE_TABLET | ORAL | Status: AC
  Filled 2022-03-10: qty 4

## 2022-03-10 MED ORDER — NITROGLYCERIN 0.4 MG SL SUBL: 0.4000 mg | SUBLINGUAL_TABLET | SUBLINGUAL | Status: DC | PRN

## 2022-03-10 MED ORDER — ACETAMINOPHEN 325 MG PO TABS
650.0000 mg | ORAL_TABLET | ORAL | Status: DC | PRN
  Administered 2022-03-11 (×2): 650 mg via ORAL
  Filled 2022-03-10 (×2): qty 2

## 2022-03-10 MED ORDER — LACTATED RINGERS IV BOLUS
500.0000 mL | Freq: Once | INTRAVENOUS | Status: AC
  Administered 2022-03-10: 500 mL via INTRAVENOUS

## 2022-03-10 MED ORDER — METOPROLOL TARTRATE 25 MG PO TABS
25.0000 mg | ORAL_TABLET | Freq: Two times a day (BID) | ORAL | Status: DC
  Administered 2022-03-10 – 2022-03-11 (×3): 25 mg via ORAL
  Filled 2022-03-10 (×4): qty 1

## 2022-03-10 MED ORDER — FOLIC ACID 1 MG PO TABS
1.0000 mg | ORAL_TABLET | Freq: Every day | ORAL | Status: DC
  Administered 2022-03-10 – 2022-03-11 (×2): 1 mg via ORAL
  Filled 2022-03-10 (×3): qty 1

## 2022-03-10 MED ORDER — DILTIAZEM HCL 25 MG/5ML IV SOLN
10.0000 mg | Freq: Once | INTRAVENOUS | Status: AC
  Administered 2022-03-10: 10 mg via INTRAVENOUS
  Filled 2022-03-10: qty 5

## 2022-03-10 MED ORDER — THIAMINE HCL 100 MG/ML IJ SOLN: 100.0000 mg | Freq: Every day | INTRAMUSCULAR | Status: DC

## 2022-03-10 MED ORDER — SODIUM CHLORIDE 0.9 % IV SOLN: INTRAVENOUS | Status: DC

## 2022-03-10 MED ORDER — ASPIRIN EC 81 MG PO TBEC
81.0000 mg | DELAYED_RELEASE_TABLET | Freq: Every day | ORAL | Status: DC
  Administered 2022-03-11: 81 mg via ORAL
  Filled 2022-03-10 (×2): qty 1

## 2022-03-10 MED ORDER — DILTIAZEM HCL-DEXTROSE 125-5 MG/125ML-% IV SOLN (PREMIX)
5.0000 mg/h | INTRAVENOUS | Status: DC
  Administered 2022-03-10 – 2022-03-11 (×2): 5 mg/h via INTRAVENOUS
  Filled 2022-03-10 (×2): qty 125

## 2022-03-10 MED ORDER — IOHEXOL 350 MG/ML SOLN
100.0000 mL | Freq: Once | INTRAVENOUS | Status: AC | PRN
  Administered 2022-03-10: 100 mL via INTRAVENOUS

## 2022-03-10 MED ORDER — LORAZEPAM 2 MG/ML IJ SOLN: 1.0000 mg | INTRAMUSCULAR | Status: DC | PRN

## 2022-03-10 MED ORDER — ONDANSETRON HCL 4 MG/2ML IJ SOLN
4.0000 mg | Freq: Four times a day (QID) | INTRAMUSCULAR | Status: DC | PRN
Start: 2022-03-10 — End: 2022-03-12

## 2022-03-10 NOTE — ED Provider Notes (Signed)
?[facility] ?Provider Note ? ? ?CSN: [id number] ?Arrival date & time: 03/10/22  1224 ? ?  ? ?History ? ?Chief Complaint  ?Patient presents with  ? atrial fibrillation w/RVR  ? ? ?Jennifer Pennington is a 66 y.o. female. ? ?HPI ? 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  She went to urgent care for chest pressure and shortness of breath that began yesterday evening.  She states it felt like an elephant was sitting on her chest.  She was seen at urgent care and was told to present to the emergency department due to atrial fibrillation with RVR.  She had previously taken 5-6 Excedrin today.  EMS administered 2 sublingual nitroglycerin with subsequent improvement.  The patient states that she has had intermittent chest pressure with exertion over the past 4 to 5 years.  She states that she has never had a ischemic cardiac evaluation.  She does not follow routinely outpatient with a physician.  She denies any clear history of alcohol withdrawal but does drink 5-6 beers every single day.  She denies any cough, fevers or chills. She reportedly had a near syncopal episode at urgent care and was found to be in atrial fibrillation with RVR and was sent to the ED for further management. ? ?Home Medications ?Prior to Admission medications   ?Medication Sig Start Date End Date Taking? Authorizing Provider  ?aspirin-acetaminophen-caffeine (EXCEDRIN MIGRAINE) 250-250-65 MG tablet Take 2 tablets by mouth every 6 (six) hours as needed for headache or migraine.   Yes [provider]  ?OVER THE COUNTER MEDICATION Take 500 mg by mouth daily as needed (arthritis  pain). Kratom.   Yes [provider]  ?OVER THE COUNTER MEDICATION Take 1 each by mouth daily as needed (anxiety). THC Delta-8. Takes a small piece of a gummy.   Yes [provider]  ?   ? ?Allergies    ?Flagyl [metronidazole]   ? ?Review of Systems   ?Review of Systems  ?Respiratory:  Positive for chest tightness and shortness of breath.   ?Cardiovascular:  Positive for chest pain and palpitations.  ?All other systems reviewed and are negative. ? ?Physical Exam ?Updated Vital Signs ?BP (!) 158/67   Pulse 93   Resp (!) 26   Ht 5\' 1"  (1.549 m)   Wt 54.4 kg   SpO2 93%   BMI 22.66 kg/m?  ?Physical Exam ?Vitals and nursing note reviewed.  ?Constitutional:   ?   General: She is not in acute distress. ?   Appearance: She is well-developed.  ?HENT:  ?   Head: Normocephalic and atraumatic.  ?Eyes:  ?   Conjunctiva/sclera: Conjunctivae normal.  ?Cardiovascular:  ?   Rate and Rhythm: Tachycardia present. Rhythm irregular.  ?Pulmonary:  ?   Effort: Pulmonary effort is normal. No respiratory distress.  ?   Breath sounds: Normal breath sounds.  ?Abdominal:  ?   Palpations: Abdomen is soft.  ?   Tenderness: There is no abdominal tenderness.  ?Musculoskeletal:     ?   General: No swelling.  ?   Cervical back: Neck supple.  ?Skin: ?   General: Skin is warm and dry.  ?   Capillary Refill:  Capillary refill takes less than 2 seconds.  ?Neurological:  ?   General: No focal deficit present.  ?   Mental Status: She is alert and oriented to person, place, and time.  ?Psychiatric:     ?   Mood and Affect: Mood normal.  ? ? ?ED Results / Procedures / Treatments   ?Labs ?(all labs ordered are listed, but only abnormal results are displayed) ?Labs Reviewed  ?CBC WITH DIFFERENTIAL/PLATELET - Abnormal; Notable for the following components:  ?    Result Value  ? WBC 11.0 (*)   ? RBC 5.28 (*)   ? Hemoglobin 16.4 (*)   ? HCT 49.0 (*)   ? Monocytes Absolute 1.1 (*)   ? All other components within normal limits  ?BRAIN  NATRIURETIC PEPTIDE - Abnormal; Notable for the following components:  ? B Natriuretic Peptide 383.2 (*)   ? All other components within normal limits  ?COMPREHENSIVE METABOLIC PANEL - Abnormal; Notable for the following components:  ? Sodium 134 (*)   ? BUN 5 (*)   ? AST 111 (*)   ? ALT 71 (*)   ? All other components within normal limits  ?D-DIMER, QUANTITATIVE - Abnormal; Notable for the following components:  ? D-Dimer, Quant 0.90 (*)   ? All other components within normal limits  ?TROPONIN I (HIGH SENSITIVITY) - Abnormal; Notable for the following components:  ? Troponin I (High Sensitivity) 29 (*)   ? All other components within normal limits  ?TROPONIN I (HIGH SENSITIVITY) - Abnormal; Notable for the following components:  ? Troponin I (High Sensitivity) 35 (*)   ? All other components within normal limits  ?MAGNESIUM  ?PROTIME-INR  ?HEPARIN LEVEL (UNFRACTIONATED)  ?CBG MONITORING, ED  ? ? ?EKG ?None ? ?Radiology ?CT Angio Chest PE W and/or Wo Contrast ? ?Result Date: 03/10/2022 ?CLINICAL DATA:  Concern for pulmonary embolus EXAM: CT ANGIOGRAPHY CHEST WITH CONTRAST TECHNIQUE: Multidetector CT imaging of the chest was performed using the standard protocol during bolus administration of intravenous contrast. Multiplanar CT image reconstructions and MIPs were obtained to evaluate the vascular anatomy. RADIATION DOSE REDUCTION: This exam was performed according to the departmental dose-optimization program which includes automated exposure control, adjustment of the mA and/or kV according to patient size and/or use of iterative reconstruction technique. CONTRAST:  134mL OMNIPAQUE IOHEXOL 350 MG/ML SOLN COMPARISON:  None. FINDINGS: Cardiovascular: Adequate contrast opacification of the pulmonary arteries. Mild cardiomegaly. No pericardial effusion. Normal caliber thoracic aorta with atherosclerotic disease. Left main and three-vessel coronary artery calcifications. Mediastinum/Nodes: Esophagus and thyroid are  unremarkable. No pathologically enlarged lymph nodes seen in the chest. Lungs/Pleura: Central airways are patent. No consolidation, pleural effusion or pneumothorax. Upper Abdomen: No acute abnormality. Musculoskeletal: No chest wall abnormality. No acute or significant osseous findings. Review of the MIP images confirms the above findings. IMPRESSION: 1. No evidence of pulmonary embolus. 2. Mild cardiomegaly. 3. Left main and three-vessel coronary artery calcifications. 4.  Aortic Atherosclerosis (ICD10-I70.0). Electronically Signed   By: Yetta Glassman M.D.   On: 03/10/2022 14:44  ? ?DG Chest Portable 1 View ? ?Result Date: 03/10/2022 ?CLINICAL DATA:  Shortness of breath, numbness and tingling in the face EXAM: PORTABLE CHEST 1 VIEW COMPARISON:  None. FINDINGS: The heart size and mediastinal contours are within normal limits. Both lungs are clear. The visualized skeletal structures are unremarkable. IMPRESSION: No active disease. Electronically Signed   By: Kathreen Devoid M.D.   On: 03/10/2022 14:11   ? ?Procedures ?Procedures  ? ? ?  Medications Ordered in ED ?Medications  ?LORazepam (ATIVAN) tablet 1-4 mg (1 mg Oral Given 03/10/22 1416)  ?  Or  ?LORazepam (ATIVAN) injection 1-4 mg ( Intravenous See Alternative 03/10/22 1416)  ?thiamine tablet 100 mg (100 mg Oral Given 03/10/22 1403)  ?  Or  ?thiamine (B-1) injection 100 mg ( Intravenous See Alternative 03/10/22 1403)  ?folic acid (FOLVITE) tablet 1 mg (1 mg Oral Given 03/10/22 1403)  ?multivitamin with minerals tablet 1 tablet (1 tablet Oral Given 03/10/22 1403)  ?nicotine polacrilex (NICORETTE) gum 2 mg (2 mg Oral Given 03/10/22 1553)  ?diltiazem (CARDIZEM) 125 mg in dextrose 5% 125 mL (1 mg/mL) infusion (5 mg/hr Intravenous New Bag/Given 03/10/22 1407)  ?heparin ADULT infusion 100 units/mL (25000 units/214mL) (800 Units/hr Intravenous New Bag/Given 03/10/22 1410)  ?metoprolol tartrate (LOPRESSOR) tablet 25 mg (25 mg Oral Given 03/10/22 1738)  ?diltiazem (CARDIZEM)  injection 10 mg (10 mg Intravenous Given 03/10/22 1242)  ?heparin bolus via infusion 2,700 Units (2,700 Units Intravenous Bolus from Bag 03/10/22 1411)  ?iohexol (OMNIPAQUE) 350 MG/ML injection 100 mL (100 mLs Intravenous Contrast

## 2022-03-10 NOTE — Progress Notes (Signed)
ANTICOAGULATION CONSULT NOTE - Initial Consult ? ?Pharmacy Consult for IV heparin ?Indication: atrial fibrillation ? ?Allergies  ?Allergen Reactions  ? Flagyl [Metronidazole] Diarrhea and Nausea And Vomiting  ? ? ?Patient Measurements: ?Height: 5\' 1"  (154.9 cm) ?Weight: 54.4 kg (119 lb 14.9 oz) ?IBW/kg (Calculated) : 47.8 ?Heparin Dosing Weight: 54.4 kg ? ?Vital Signs: ?Temp: 98.2 ?F (36.8 ?C) (04/24 1031) ?Temp Source: Oral (04/24 1031) ?BP: 165/92 (04/24 1330) ?Pulse Rate: 98 (04/24 1330) ? ?Labs: ?Recent Labs  ?  03/10/22 ?1245  ?HGB 16.4*  ?HCT 49.0*  ?PLT 270  ?LABPROT 12.9  ?INR 1.0  ? ? ?CrCl cannot be calculated (Patient's most recent lab result is older than the maximum 21 days allowed.). ? ? ?Medical History: ?Past Medical History:  ?Diagnosis Date  ? Arthritis   ? Gout   ? ?Assessment: ?66 yo female with a history of atrial fibrillation presenting due to chest pressure, shortness of breath, numbness, and tingling. Patient is not on anticoagulation PTA. Pharmacy has been consulted for heparin. ? ?CBC within normal limits.  ? ?Goal of Therapy:  ?Heparin level 0.3-0.7 units/ml ?Monitor platelets by anticoagulation protocol: Yes ?  ?Plan:  ?Heparin 2700 units IV x1 followed by heparin 800 units/hr  ?8h heparin level ?Monitor for s/sx of bleeding ? ?Thank you for including pharmacy in the care of this patient. ? ?Zenaida Deed, PharmD ?PGY1 Acute Care Pharmacy Resident  ?Phone: [phone] ?03/10/2022  1:43 PM ? ?Please check [email or web address] for unit-specific pharmacy phone numbers. ? ? ?

## 2022-03-10 NOTE — H&P (Signed)
?Cardiology Consultation:  ? ?Patient ID: Jennifer Pennington ?MRN: [id number]; DOB: 07-26-1956 ? ?Admit date: 03/10/2022 ?Date of Consult: 03/10/2022 ? ?PCP:  Pcp, No ?  ?[facility] Providers ?Cardiologist:  None      ? ?CC: chest pain palpitations  ? ?Patient Profile:  ? ?Jennifer Pennington is a 66 y.o. female with a hx of gout, tobacco use, ETOH use (5-6 beers per day) has not seen MD is some time who is being seen 03/10/2022 for the evaluation of atrial fib and chest pain at the request of Dr. Armandina Gemma.. ? ?History of Present Illness:  ? ?Jennifer Pennington with above hx but not seen by MD in some time presented to Urgent care today with chest pain, SOB and elevated BP and then she had syncope.  She has had chest pain for some time but does not last.  This SOB and chest pain began at 0100 today woke her from sleep - the severity would come and go, she could not go back to sleep,  along with palpitations.  She did have associated SOB and mild nausea at 0100.  Initially felt like severe "Charley Horse"  then more of a hug on chest. Pressure "like an elephant sitting on her chest.  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  NTG by EMS did help with her chest pain.  She has been given 10 mg IV dilt and then drip converted to ST vs AT.  She has been placed on IV heparin.  Her BP is elevated as well.  ? ?Cardiac CTA neg for PE but + Left main and three-vessel coronary artery calcifications.   ?PCXR lungs are clear.  ? ?Na 134 K+ 3.5 BUn 5 Cr 0.66 Mg+ 1.9 AST 111 ALT 71  ?BNP 383 ?Hs troponin 29, 35  ?WBC 11 ?Hgb 16.4  plts 270 ?Ddimer 0.90 ? ? ?EKG:  The EKG was personally reviewed and demonstrates:    ST with PACs non specific t WAVE CHANGES ?  ?Telemetry:  Telemetry was personally reviewed and demonstrates:  ST with runs of SVT  ? ?On dilt , heparin and IV fluids ? ?BP 170/91 p 100 R 32-17  ?+ FH  CAD with her father at 73.  ? ?Past Medical History:  ?Diagnosis Date  ? Arthritis   ? Gout   ? ? ?Past Surgical History:  ?Procedure Laterality Date  ? TUBAL LIGATION    ?  ? ?Home Medications:  ?Prior to Admission medications   ?Medication Sig Start Date End Date Taking? Authorizing Provider  ?aspirin-acetaminophen-caffeine (EXCEDRIN MIGRAINE) 250-250-65 MG tablet Take 2 tablets by mouth every 6 (six) hours as needed for headache or migraine.   Yes [provider]  ?OVER THE COUNTER MEDICATION Take 500 mg by mouth daily as needed (arthritis pain). Kratom.   Yes [provider]  ?OVER THE COUNTER MEDICATION Take 1 each by mouth daily as needed (anxiety). THC Delta-8. Takes a small piece of a gummy.   Yes [provider]  ? ? ?Inpatient Medications: ?Scheduled Meds: ? folic acid  1 mg Oral Daily  ? metoprolol tartrate  25 mg Oral BID  ? multivitamin with minerals  1 tablet Oral Daily  ? thiamine  100 mg Oral Daily  ? Or  ? thiamine  100 mg Intravenous Daily  ? ?Continuous  Infusions: ? diltiazem (CARDIZEM) infusion 5 mg/hr (03/10/22 1407)  ? heparin 800 Units/hr (03/10/22 1410)  ? ?PRN Meds: ?LORazepam **OR** LORazepam, nicotine polacrilex ? ?Allergies:    ?Allergies  ?Allergen Reactions  ? Flagyl [Metronidazole] Diarrhea and Nausea And Vomiting  ? ? ?Social History:   ?Social History  ? ?Socioeconomic History  ? Marital status: Married  ?  Spouse name: Not on file  ? Number of children: Not on file  ? Years of education: Not on file  ? Highest education level: Not on file  ?Occupational History  ? Not on file  ?Tobacco Use  ? Smoking status: Every Day  ?  Packs/day: 1.00  ?  Types: Cigarettes  ? Smokeless tobacco: Never  ?Substance and Sexual Activity  ? Alcohol use: Yes  ?  Alcohol/week: 35.0 standard drinks  ?  Types: 35 Cans of beer per week  ? Drug use: Never  ? Sexual activity: Not on file  ?Other Topics Concern  ? Not on file  ?Social History Narrative  ? Not on file  ? ?Social  Determinants of Health  ? ?Financial Resource Strain: Not on file  ?Food Insecurity: Not on file  ?Transportation Needs: Not on file  ?Physical Activity: Not on file  ?Stress: Not on file  ?Social Connections: Not on file  ?Intimate Partner Violence: Not on file  ?  ?Family History:   ? ?Family History  ?Problem Relation Age of Onset  ? Heart disease Father   ?  ? ?ROS:  ?Please see the history of present illness.  ?General:no colds or fevers, no weight changes ?Skin:no rashes or ulcers ?HEENT:no blurred vision, no congestion ?CV:see HPI ?PUL:see HPI ?GI:no diarrhea constipation or melena, no indigestion ?GU:no hematuria, no dysuria ?MS:no joint pain, no claudication ?Neuro:no syncope, no lightheadedness ?Endo:no diabetes, no thyroid disease ? ?All other ROS reviewed and negative.    ? ?Physical Exam/Data:  ? ?Vitals:  ? 03/10/22 1500 03/10/22 1515 03/10/22 1530 03/10/22 1545  ?BP: (!) 179/86 (!) 167/99 (!) 173/91 (!) 170/91  ?Pulse: (!) 126 (!) 53 64 (!) 43  ?Resp: (!) 27 (!) 26 (!) 27 (!) 21  ?SpO2: 92% 93% 95% 95%  ?Weight:      ?Height:      ? ?No intake or output data in the 24 hours ending 03/10/22 1648 ? ?  03/10/2022  ? 12:34 PM 03/10/2022  ? 10:23 AM 11/20/2009  ?  1:49 PM  ?Last 3 Weights  ?Weight (lbs) 119 lb 14.9 oz 120 lb 155 lb  ?Weight (kg) 54.4 kg 54.432 kg 70.308 kg  ?   ?Body mass index is 22.66 kg/m?.  ?General:  Well nourished, well developed, in no acute distress ?HEENT: normal ?Neck: no JVD ?Vascular: No carotid bruits; Distal pulses 2+ bilaterally ?Cardiac:  normal S1, S2; RRR; no murmur gallup rub or click ?Lungs:  rhonchi to auscultation bilaterally, no wheezing, rhonchi or rales  ?Abd: soft, nontender, no hepatomegaly  ?Ext: no edema ?Musculoskeletal:  No deformities, BUE and BLE strength normal and equal ?Skin: warm and dry  ?Neuro: alert and oriented X 3 MAE follows commands, no focal abnormalities noted ?Psych:  Normal affect  ? ?Relevant CV Studies: ?NONE ECHO ORDERED ? ?Laboratory  Data: ? ?High Sensitivity Troponin:   ?Recent Labs  ?Lab 03/10/22 ?1245 03/10/22 ?1430  ?TROPONINIHS 29* 35*  ?   ?Chemistry ?Recent Labs  ?Lab 03/10/22 ?1245  ?NA 134*  ?K 3.5  ?CL 100  ?CO2 25  ?GLUCOSE 97  ?  BUN 5*  ?CREATININE 0.66  ?CALCIUM 9.4  ?MG 1.9  ?GFRNONAA >60  ?ANIONGAP 9  ?  ?Recent Labs  ?Lab 03/10/22 ?1245  ?PROT 7.5  ?ALBUMIN 3.8  ?AST 111*  ?ALT 71*  ?ALKPHOS 92  ?BILITOT 0.7  ? ?Lipids No results for input(s): CHOL, TRIG, HDL, LABVLDL, LDLCALC, CHOLHDL in the last 168 hours.  ?Hematology ?Recent Labs  ?Lab 03/10/22 ?1245  ?WBC 11.0*  ?RBC 5.28*  ?HGB 16.4*  ?HCT 49.0*  ?MCV 92.8  ?MCH 31.1  ?MCHC 33.5  ?RDW 14.0  ?PLT 270  ? ?Thyroid No results for input(s): TSH, FREET4 in the last 168 hours.  ?BNP ?Recent Labs  ?Lab 03/10/22 ?1245  ?BNP 383.2*  ?  ?DDimer  ?Recent Labs  ?Lab 03/10/22 ?1245  ?DDIMER 0.90*  ? ? ? ?Radiology/Studies:  ?CT Angio Chest PE W and/or Wo Contrast ? ?Result Date: 03/10/2022 ?CLINICAL DATA:  Concern for pulmonary embolus EXAM: CT ANGIOGRAPHY CHEST WITH CONTRAST TECHNIQUE: Multidetector CT imaging of the chest was performed using the standard protocol during bolus administration of intravenous contrast. Multiplanar CT image reconstructions and MIPs were obtained to evaluate the vascular anatomy. RADIATION DOSE REDUCTION: This exam was performed according to the departmental dose-optimization program which includes automated exposure control, adjustment of the mA and/or kV according to patient size and/or use of iterative reconstruction technique. CONTRAST:  174mL OMNIPAQUE IOHEXOL 350 MG/ML SOLN COMPARISON:  None. FINDINGS: Cardiovascular: Adequate contrast opacification of the pulmonary arteries. Mild cardiomegaly. No pericardial effusion. Normal caliber thoracic aorta with atherosclerotic disease. Left main and three-vessel coronary artery calcifications. Mediastinum/Nodes: Esophagus and thyroid are unremarkable. No pathologically enlarged lymph nodes seen in the chest.  Lungs/Pleura: Central airways are patent. No consolidation, pleural effusion or pneumothorax. Upper Abdomen: No acute abnormality. Musculoskeletal: No chest wall abnormality. No acute or significant osseous findings

## 2022-03-10 NOTE — ED Notes (Signed)
Patient transported to CT 

## 2022-03-10 NOTE — ED Provider Notes (Signed)
?[facility] ? ? ? ?CSN: [id number] ?Arrival date & time: 03/10/22  1010 ? ? ?  ? ?History   ?Chief Complaint ?Chief Complaint  ?Patient presents with  ? Chest Pain  ?  Chest pain, sob, and blood pressure running high. X1 day  ? ? ?HPI ?Jennifer Pennington is a 66 y.o. female.  ? ?Patient reports that she was awakened at about 3am today by pressure-like pain and tightness in her anterior chest, nausea (without vomiting) and shortness of breath.  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  During this interval she took several Excedrin tablets.  After arriving at work her symptoms persisted and she believes that she may have fainted.  While at work her BP was measured at 193/130, and she was brought to urgent care.   ?She states that she feels slightly better, but still has pressure in her anterior chest.  She admits that she has experienced intermittent chest discomfort about once per month for the past several years, but none as severe as today's episode. ?Patient moved to this area about two years ago and has not had regular medical care.  She continues to smoke about one pack per day cigarettes and drinks 5 to 6 beers per day.  Past medical history of gout and osteoarthritis.  She has a family history of heart disease. ? ?The history is provided by the patient and a relative.  ?Chest Pain ?Pain location:  Substernal area ?Pain quality: crushing and pressure   ?Pain radiates to:  Does not radiate ?Pain severity:  Moderate ?Onset quality:  Sudden ?Duration:  7 hours ?Timing:  Constant ?Progression:  Improving ?Chronicity:  Recurrent ?Context: at rest   ?Relieved by:  Nothing ?Worsened by:  Nothing ?Ineffective treatments: Excedrin. ?Associated symptoms: fatigue, nausea, palpitations, shortness of breath and syncope   ?Associated symptoms: no abdominal pain, no cough, no diaphoresis, no dizziness, no fever, no headache, no lower extremity edema and no vomiting   ?Risk factors:  hypertension and smoking   ? ?Past Medical History:  ?Diagnosis Date  ? Arthritis   ? Gout   ? ? ?Patient Active Problem List  ? Diagnosis Date Noted  ? BREAST MASS, RIGHT 12/20/2009  ? HYPERLIPIDEMIA 09/10/2009  ? Gout, unspecified 09/07/2009  ? BLOOD IN STOOL 09/07/2009  ? UNSPECIFIED ARTHROPATHY MULTIPLE SITES 09/07/2009  ? ABDOMINAL PAIN RIGHT LOWER QUADRANT 09/07/2009  ? COLONIC POLYPS, HX OF 09/07/2009  ? ? ?Past Surgical History:  ?Procedure Laterality Date  ? TUBAL LIGATION    ? ? ?OB History   ?No obstetric history on file. ?  ? ? ? ?Home Medications   ? ?Prior to Admission medications   ?Medication Sig Start Date End Date Taking? Authorizing Provider  ?HYDROcodone-acetaminophen (NORCO) 5-325 MG per tablet Take 1 tablet by mouth up to 4 times a day as needed for pain 03/31/12   Dianne Dun, MD  ? ? ?Family History ?Heart Disease ? ?Social History ?Social History  ? ?Tobacco Use  ? Smoking status: Every Day  ?  Packs/day: 1.00  ?  Types: Cigarettes  ? Smokeless tobacco: Never  ?Substance Use Topics  ? Alcohol use: Yes  ? Drug use: Never  ? ? ? ?Allergies   ?Patient has no known allergies. ? ? ?Review of Systems ?Review of Systems  ?Constitutional:  Positive for activity change and fatigue. Negative for diaphoresis and fever.  ?HENT: Negative.    ?Eyes: Negative.   ?  Respiratory:  Positive for chest tightness and shortness of breath. Negative for cough.   ?Cardiovascular:  Positive for chest pain, palpitations and syncope. Negative for leg swelling.  ?Gastrointestinal:  Positive for nausea. Negative for abdominal pain and vomiting.  ?Genitourinary: Negative.   ?Musculoskeletal: Negative.   ?Skin: Negative.   ?Neurological:  Negative for dizziness, facial asymmetry and headaches.  ?Hematological:  Negative for adenopathy.  ? ? ?Physical Exam ?Triage Vital Signs ?ED Triage Vitals  ?Enc Vitals Group  ?   BP 03/10/22 1031 (!) 184/99  ?   Pulse Rate 03/10/22 1031 96  ?   Resp --   ?   Temp 03/10/22 1031 98.2 ?F  (36.8 ?C)  ?   Temp Source 03/10/22 1031 Oral  ?   SpO2 03/10/22 1031 99 %  ?   Weight 03/10/22 1023 120 lb (54.4 kg)  ?   Height 03/10/22 1023 5\' 1"  (1.549 m)  ?   Head Circumference --   ?   Peak Flow --   ?   Pain Score 03/10/22 1023 3  ?   Pain Loc --   ?   Pain Edu? --   ?   Excl. in GC? --   ? ?No data found. ? ?Updated Vital Signs ?BP (!) 184/99 (BP Location: Right Arm)   Pulse 96   Temp 98.2 ?F (36.8 ?C) (Oral)   Ht 5\' 1"  (1.549 m)   Wt 54.4 kg   SpO2 99%   BMI 22.67 kg/m?  ? ?Visual Acuity ?Right Eye Distance:   ?Left Eye Distance:   ?Bilateral Distance:   ? ?Right Eye Near:   ?Left Eye Near:    ?Bilateral Near:    ? ?Physical Exam ?Vitals and nursing note reviewed.  ?Constitutional:   ?   Appearance: She is not ill-appearing or toxic-appearing.  ?   Comments: Patient smells of tobacco smoke  ?HENT:  ?   Head: Normocephalic.  ?Eyes:  ?   Extraocular Movements: Extraocular movements intact.  ?   Pupils: Pupils are equal, round, and reactive to light.  ?Cardiovascular:  ?   Rate and Rhythm: Normal rate. Rhythm irregularly irregular.  ?   Pulses: Normal pulses.  ?   Heart sounds: Normal heart sounds.  ?Pulmonary:  ?   Effort: Pulmonary effort is normal. No respiratory distress.  ?   Breath sounds: Examination of the right-middle field reveals decreased breath sounds. Examination of the left-middle field reveals decreased breath sounds. Examination of the right-lower field reveals wheezing. Examination of the left-lower field reveals wheezing. Decreased breath sounds and wheezing present.  ?Chest:  ?   Chest wall: No tenderness.  ?Abdominal:  ?   Palpations: Abdomen is soft.  ?   Tenderness: There is no abdominal tenderness.  ?Musculoskeletal:  ?   Cervical back: Neck supple.  ?   Right lower leg: No tenderness. No edema.  ?   Left lower leg: No tenderness. No edema.  ?Lymphadenopathy:  ?   Cervical: No cervical adenopathy.  ?Skin: ?   General: Skin is warm and dry.  ?   Comments: Ruddy complexion   ?Neurological:  ?   Mental Status: She is alert and oriented to person, place, and time.  ? ? ? ?UC Treatments / Results  ?Labs ?(all labs ordered are listed, but only abnormal results are displayed) ?Labs Reviewed - No data to display ? ?EKG ? ?Rate:  106 BPM ?PR:   03/12/22 ?QT:  392 msec ?QTcH:  520 msec ?QRSD:  82 msec ?QRS axis:   46degrees ?Interpretation:  Atrial fibrillation; non-specific T-wave abnormality ? ?Radiology ?No results found. ? ?Procedures ?Procedures (including critical care time) ? ?Medications Ordered in UC ?Medications - No data to display ? ?Initial Impression / Assessment and Plan / UC Course  ?I have reviewed the triage vital signs and the nursing notes. ? ?Pertinent labs & imaging results that were available during my care of the patient were reviewed by me and considered in my medical decision making (see chart for details). ? ?  ??ischemia also.  Note patient has already had several Excedrin tabs since 3am today.  Will avoid administering aspirin. ?[facility].  Patient's vital signs stable for transport. ? ?Final Clinical Impressions(s) / UC Diagnoses  ? ?Final diagnoses:  ?New onset atrial fibrillation (HCC)  ?Uncontrolled hypertension  ? ?Discharge Instructions   ?None ?  ? ?ED Prescriptions   ?None ?  ? ? ?  ?Lattie HawBeese, Bohdi Leeds A, MD ?03/12/22 1525 ? ?

## 2022-03-10 NOTE — ED Triage Notes (Signed)
Pt states  that she was at work and fainted. Pt states that she has some chest pain, sob, and blood pressure running high. X1 day ?

## 2022-03-10 NOTE — ED Triage Notes (Signed)
Pt arrived via [facility] from UC, because pt was inA-fib w/RVR. Pt went to UC for chest pressure, SOB, intermittent numbness and tingling of left face and left arm that started at 0100 today. Per EMS, pt has taken exccedrin 5-6 pills since 0100 today. EMS gave nitrox2. Pt also hypertensive. Per EMS, pt has had this chest pressure throughout the past 4-5 yrs, but usually resolves on it's own. Pt hasn't seen a dr in years. Pt is in A-fib w/RVR on monitor w/ST depression. Pt hypertensive. ?

## 2022-03-11 ENCOUNTER — Inpatient Hospital Stay (HOSPITAL_COMMUNITY): Payer: BLUE CROSS/BLUE SHIELD

## 2022-03-11 ENCOUNTER — Other Ambulatory Visit: Payer: Self-pay | Admitting: Cardiology

## 2022-03-11 DIAGNOSIS — R079 Chest pain, unspecified: Secondary | ICD-10-CM | POA: Diagnosis not present

## 2022-03-11 DIAGNOSIS — I471 Supraventricular tachycardia: Secondary | ICD-10-CM

## 2022-03-11 DIAGNOSIS — I2510 Atherosclerotic heart disease of native coronary artery without angina pectoris: Secondary | ICD-10-CM

## 2022-03-11 DIAGNOSIS — R931 Abnormal findings on diagnostic imaging of heart and coronary circulation: Secondary | ICD-10-CM

## 2022-03-11 DIAGNOSIS — R072 Precordial pain: Secondary | ICD-10-CM

## 2022-03-11 DIAGNOSIS — I2584 Coronary atherosclerosis due to calcified coronary lesion: Secondary | ICD-10-CM

## 2022-03-11 LAB — CBC
HCT: 44.8 % (ref 36.0–46.0)
Hemoglobin: 15.2 g/dL — ABNORMAL HIGH (ref 12.0–15.0)
MCH: 31.4 pg (ref 26.0–34.0)
MCHC: 33.9 g/dL (ref 30.0–36.0)
MCV: 92.6 fL (ref 80.0–100.0)
Platelets: 253 10*3/uL (ref 150–400)
RBC: 4.84 MIL/uL (ref 3.87–5.11)
RDW: 14.2 % (ref 11.5–15.5)
WBC: 9.4 10*3/uL (ref 4.0–10.5)
nRBC: 0 % (ref 0.0–0.2)

## 2022-03-11 LAB — LIPID PANEL
Cholesterol: 183 mg/dL (ref 0–200)
HDL: 67 mg/dL (ref >40)
LDL Cholesterol: 102 mg/dL — ABNORMAL HIGH (ref 0–99)
Total CHOL/HDL Ratio: 2.7 RATIO
Triglycerides: 70 mg/dL (ref <150)
VLDL: 14 mg/dL (ref 0–40)

## 2022-03-11 LAB — ECHOCARDIOGRAM COMPLETE
AR max vel: 2.66 cm2
AV Peak grad: 8.4 mmHg
Ao pk vel: 1.45 m/s
Area-P 1/2: 3.97 cm2
Height: 61 in
S' Lateral: 3.6 cm
Weight: 1989.43 oz

## 2022-03-11 LAB — TROPONIN I (HIGH SENSITIVITY): Troponin I (High Sensitivity): 27 ng/L — ABNORMAL HIGH (ref <18)

## 2022-03-11 LAB — HEPARIN LEVEL (UNFRACTIONATED): Heparin Unfractionated: 0.53 IU/mL (ref 0.30–0.70)

## 2022-03-11 IMAGING — CT CT HEART MORP W/ CTA COR W/ SCORE W/ CA W/CM &/OR W/O CM
4 of 7 series · 8 of 20 positions shown, 9 images · IV contrast (APPLIED)
Comparison: None.
COMPARISON: None.

Addendum:
EXAM:
OVER-READ INTERPRETATION  CT CHEST

The following report is an over-read performed by radiologist Dr.
Nazareth Jumper [[email or web address]] on 03/11/2022. This
over-read does not include interpretation of cardiac or coronary
anatomy or pathology. The coronary calcium score/coronary CTA
interpretation by the cardiologist is attached.
CLINICAL DATA: 65 year with chest pain.
Cardiac/Coronary  CTA
TECHNIQUE: The patient was scanned on a [employer] Force scanner.

[Series 6: ts diast · axial · 0.42mm/px · z∈[+1083,+1124]mm · 2 of 309 slices shown, 3 images]
[im 103/309  vessel]
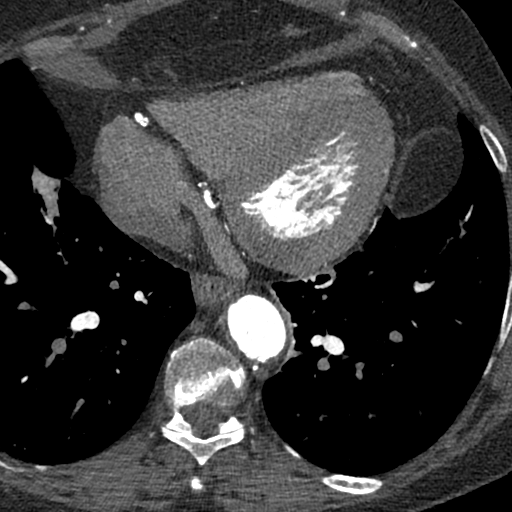
[im 103/309  lung]
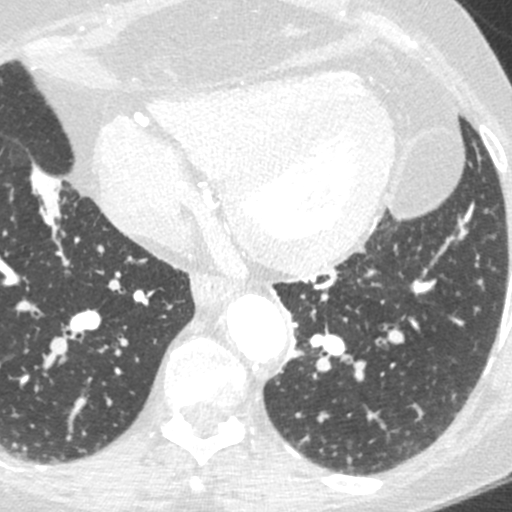
[im 206/309  vessel]
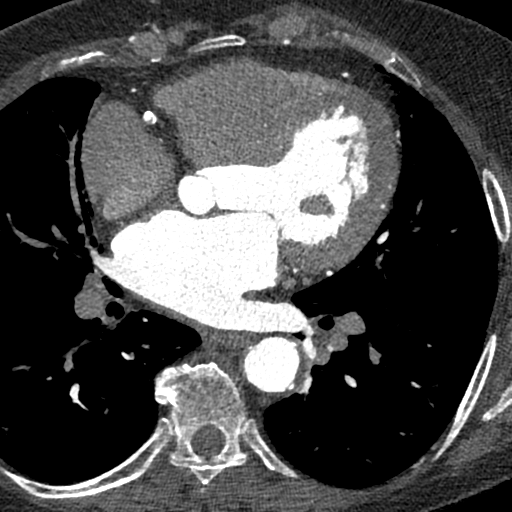

[Series 7: best diast · axial · 0.42mm/px · z∈[+1083,+1124]mm · 2 of 309 slices shown]
[im 103/309  vessel]
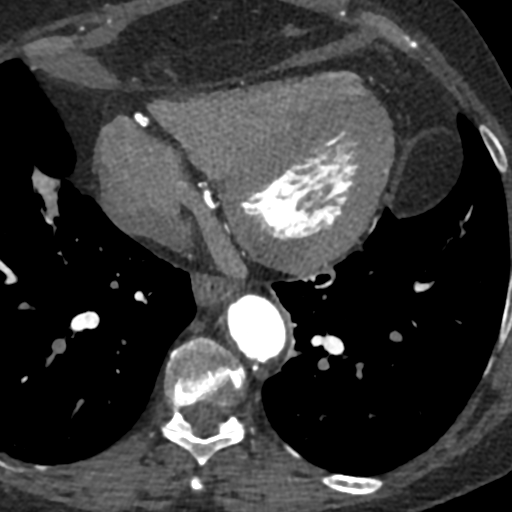
[im 206/309  vessel]
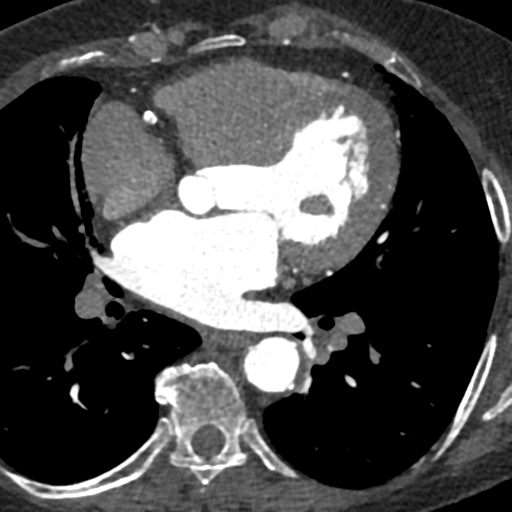

[Series 8: best syst · axial · 0.42mm/px · z∈[+1083,+1124]mm · 2 of 309 slices shown]
[im 103/309  vessel]
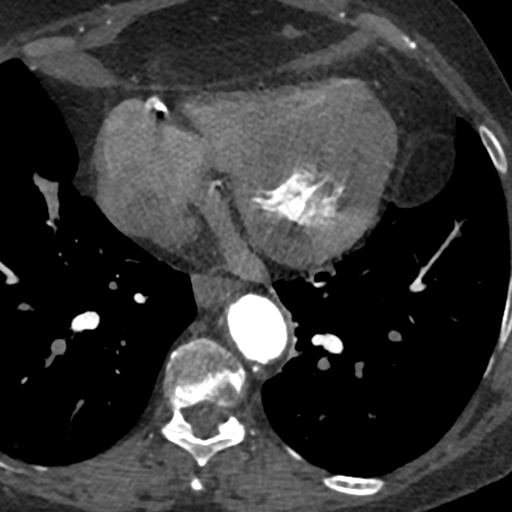
[im 206/309  vessel]
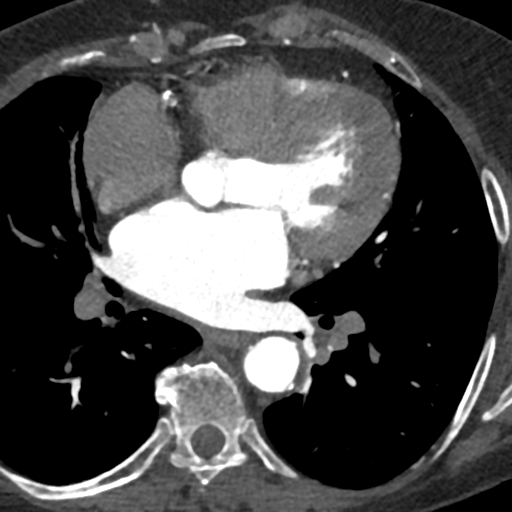

[Series 9: ts syst · axial · 0.42mm/px · z∈[+1083,+1124]mm · 2 of 309 slices shown]
[im 103/309  vessel]
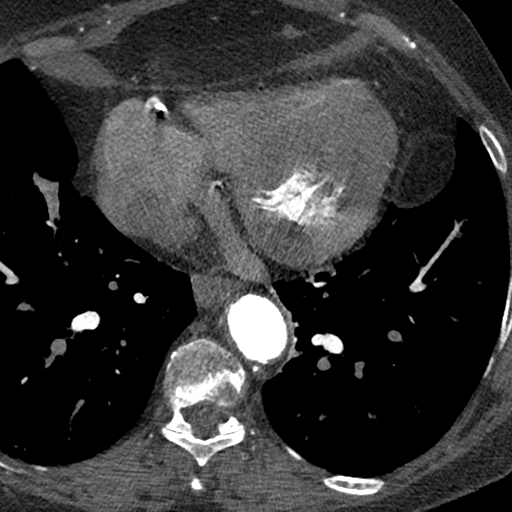
[im 206/309  vessel]
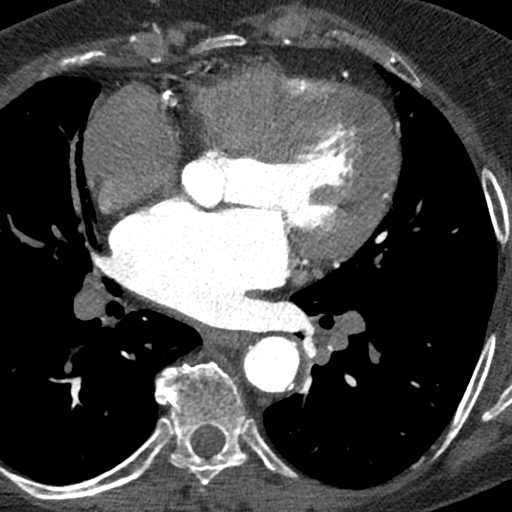

[8 of 20 positions shown; findings below may reference images not displayed]

FINDINGS: Vascular: Normal caliber thoracic aorta with moderate calcified and
noncalcified soft plaque. No suspicious filling defects of the
central pulmonary arteries.

Mediastinum/Nodes: Esophagus is unremarkable. No pathologically
enlarged lymph nodes seen in the chest.

Lungs/Pleura: Central airways are patent. Mild subsegmental
atelectasis of the right middle lobe. No consolidation, pleural
effusion or pneumothorax.

Upper Abdomen: No acute abnormality.

Musculoskeletal: No chest wall mass or suspicious bone lesions
identified.
IMPRESSION: No significant extracardiac findings.
FINDINGS: A 120 kV prospective scan was triggered in the descending thoracic
aorta at 111 HU's. Axial non-contrast 3 mm slices were carried out
through the heart. The data set was analyzed on a dedicated work
station and scored using the Agatson method. Gantry rotation speed
was 250 msecs and collimation was .6 mm. 0.8 mg of sl NTG was given.
The 3D data set was reconstructed in 5% intervals of the 67-82 % of
the R-R cycle. Diastolic phases were analyzed on a dedicated work
station using MPR, MIP and VRT modes. The patient received 80 cc of
contrast.

Image quality: good

Aorta:  Normal size.  Aortic atherosclerosis.  No dissection.

Aortic Valve:  Trileaflet.  Mild annular calcifications.

Coronary Arteries:  Normal coronary origin.  Right dominance.

RCA is a large dominant artery that gives rise to PDA and PLA. There
is diffuse mixed plaque proximal to distal (25-49%). FFR 0.88 (non
flow limiting)

Left main is a large artery that gives rise to LAD, Ramus and LCX
arteries. Ostial left main calcified plaque (no stenosis).

LAD is a large vessel that has ostial/ proximal calcified plaque
(0-24%).

LCX is a non-dominant artery that gives rise to one large OM1
branch. There is mild calcified ostial plaque (no stenosis). There
is distal secondary branch OM stenosis 75-99% (SMALL CALIBER
VESSEL).

Other findings:

Normal pulmonary vein drainage into the left atrium.

Normal left atrial appendage without a thrombus.

Normal size of the pulmonary artery.

Please see radiology report for non cardiac findings.
IMPRESSION: 1. Coronary calcium score of 5555. This was 99 percentile for age
and sex matched control.

2. Normal coronary origin with right dominance.

3. Diffuse RCA mixed calcified and non calcified plaque (25-49%)
with FFR 0.88 (non flow limiting).

4. Distal small branch OM stenosis 75-99% (appears to small for PCI)

5.  Ostial left main mild calcified plaque (no stenosis)

6.  Aortic atherosclerosis.

7. Continue with secondary medical prevention. If symptoms worsen or
become more worrisome, consider angiogram.

*** End of Addendum ***
EXAM:
OVER-READ INTERPRETATION  CT CHEST

The following report is an over-read performed by radiologist Dr.
Nazareth Jumper [REDACTED] on 03/11/2022. This
over-read does not include interpretation of cardiac or coronary
anatomy or pathology. The coronary calcium score/coronary CTA
interpretation by the cardiologist is attached.
FINDINGS: Vascular: Normal caliber thoracic aorta with moderate calcified and
noncalcified soft plaque. No suspicious filling defects of the
central pulmonary arteries.

Mediastinum/Nodes: Esophagus is unremarkable. No pathologically
enlarged lymph nodes seen in the chest.

Lungs/Pleura: Central airways are patent. Mild subsegmental
atelectasis of the right middle lobe. No consolidation, pleural
effusion or pneumothorax.

Upper Abdomen: No acute abnormality.

Musculoskeletal: No chest wall mass or suspicious bone lesions
identified.
IMPRESSION: No significant extracardiac findings.

## 2022-03-11 MED ORDER — IOHEXOL 350 MG/ML SOLN
100.0000 mL | Freq: Once | INTRAVENOUS | Status: AC | PRN
Start: 2022-03-11 — End: 2022-03-11
  Administered 2022-03-11: 100 mL via INTRAVENOUS

## 2022-03-11 MED ORDER — METOPROLOL TARTRATE 50 MG PO TABS: 50.0000 mg | ORAL_TABLET | Freq: Once | ORAL | Status: DC

## 2022-03-11 MED ORDER — NITROGLYCERIN 0.4 MG SL SUBL
SUBLINGUAL_TABLET | SUBLINGUAL | Status: AC
  Filled 2022-03-11: qty 2

## 2022-03-11 MED ORDER — NITROGLYCERIN 0.4 MG SL SUBL
0.8000 mg | SUBLINGUAL_TABLET | Freq: Once | SUBLINGUAL | Status: AC
  Administered 2022-03-11: 0.8 mg via SUBLINGUAL

## 2022-03-11 MED ORDER — HEPARIN BOLUS VIA INFUSION
1500.0000 [IU] | Freq: Once | INTRAVENOUS | Status: AC
  Administered 2022-03-11: 1500 [IU] via INTRAVENOUS
  Filled 2022-03-11: qty 1500

## 2022-03-11 NOTE — Plan of Care (Signed)
A&Ox4, oriented to the unit. Vitals stable. IV heparin, cardizem, and NS running; heart rates in 60s. States feeling much better, rating pain low at a 2 out of 10 (L chest pressure). No shortness of breath. Ambulating well and in good spirits. Anticipating ECHO and possibility of heart cath. Awaiting rounds to speak with team. Family involved and wanting updates.  ? ?Problem: Education: ?Goal: Knowledge of General Education information will improve ?Description: Including pain rating scale, medication(s)/side effects and non-pharmacologic comfort measures ?Outcome: Progressing ?  ?Problem: Health Behavior/Discharge Planning: ?Goal: Ability to manage health-related needs will improve ?Outcome: Progressing ?  ?Problem: Clinical Measurements: ?Goal: Ability to maintain clinical measurements within normal limits will improve ?Outcome: Progressing ?Goal: Will remain free from infection ?Outcome: Progressing ?Goal: Diagnostic test results will improve ?Outcome: Progressing ?Goal: Respiratory complications will improve ?Outcome: Progressing ?Goal: Cardiovascular complication will be avoided ?Outcome: Progressing ?  ?Problem: Activity: ?Goal: Risk for activity intolerance will decrease ?Outcome: Progressing ?  ?Problem: Nutrition: ?Goal: Adequate nutrition will be maintained ?Outcome: Progressing ?  ?Problem: Coping: ?Goal: Level of anxiety will decrease ?Outcome: Progressing ?  ?Problem: Elimination: ?Goal: Will not experience complications related to bowel motility ?Outcome: Progressing ?Goal: Will not experience complications related to urinary retention ?Outcome: Progressing ?  ?Problem: Pain Managment: ?Goal: General experience of comfort will improve ?Outcome: Progressing ?  ?Problem: Safety: ?Goal: Ability to remain free from injury will improve ?Outcome: Progressing ?  ?Problem: Skin Integrity: ?Goal: Risk for impaired skin integrity will decrease ?Outcome: Progressing ?  ?

## 2022-03-11 NOTE — Progress Notes (Signed)
ANTICOAGULATION CONSULT NOTE ?Pharmacy Consult for heparin ?Indication: atrial fibrillation ? ? ?Allergies  ?Allergen Reactions  ? Flagyl [Metronidazole] Diarrhea and Nausea And Vomiting  ? ? ?Patient Measurements: ?Height: 5\' 1"  (154.9 cm) ?Weight: 56.4 kg (124 lb 5.4 oz) ?IBW/kg (Calculated) : 47.8 ?Heparin Dosing Weight: 54.4 kg ? ?Vital Signs: ?Temp: 99 ?F (37.2 ?C) (04/25 11-02-1990) ?Temp Source: Oral (04/25 11-02-1990) ?BP: 117/62 (04/25 1200) ?Pulse Rate: 62 (04/25 1200) ? ?Labs: ?Recent Labs  ?  03/10/22 ?1245 03/10/22 ?1430 03/10/22 ?2126 03/11/22 ?0741  ?HGB 16.4*  --   --  15.2*  ?HCT 49.0*  --   --  44.8  ?PLT 270  --   --  253  ?LABPROT 12.9  --   --   --   ?INR 1.0  --   --   --   ?HEPARINUNFRC  --   --  0.20* 0.53  ?CREATININE 0.66  --   --   --   ?TROPONINIHS 29* 35* 31* 27*  ? ? ? ?Estimated Creatinine Clearance: 54.3 mL/min (by C-G formula based on SCr of 0.66 mg/dL). ? ?Assessment: ?66 y.o. female with chest pain on heparin. Plans for cardiac CTA today ?-heparin level at goal on 950 units/hr ?-CBC stable ? ?Goal of Therapy:  ?Heparin level 0.3-0.7 units/ml ?Monitor platelets by anticoagulation protocol: Yes ?  ?Plan:  ?-Continue heparin at 950 units/hr ?-Daily heparin level and CBC ? ?66, PharmD ?Clinical Pharmacist ?**Pharmacist phone directory can now be found on [email or web address] ([email or web address]).  Listed under [facility]. ? ? ? ?  ? ? ?

## 2022-03-11 NOTE — Progress Notes (Addendum)
? ?Progress Note ? ?Patient Name: Jennifer Pennington ?Date of Encounter: 03/11/2022 ? ?[facility] Cardiologist: Pixie Casino, MD new ? ?Subjective  ? ?Not aware of irreg HR, just the chest pain. That has resolved. Has had it before, when she exerts herself and gets out of breath. Cramping L chest pain, relieved by rest.  ? ?Inpatient Medications  ?  ?Scheduled Meds: ? aspirin  324 mg Oral NOW  ? Or  ? aspirin  300 mg Rectal NOW  ? aspirin EC  81 mg Oral Daily  ? atorvastatin  80 mg Oral Daily  ? folic acid  1 mg Oral Daily  ? metoprolol tartrate  25 mg Oral BID  ? multivitamin with minerals  1 tablet Oral Daily  ? nitroGLYCERIN  1 inch Topical Q6H  ? thiamine  100 mg Oral Daily  ? Or  ? thiamine  100 mg Intravenous Daily  ? ?Continuous Infusions: ? sodium chloride 10 mL/hr at 03/10/22 2211  ? diltiazem (CARDIZEM) infusion 5 mg/hr (03/10/22 1407)  ? heparin 950 Units/hr (03/11/22 0117)  ? ?PRN Meds: ?acetaminophen, LORazepam **OR** LORazepam, nicotine polacrilex, nitroGLYCERIN, ondansetron (ZOFRAN) IV  ? ?Vital Signs  ?  ?Vitals:  ? 03/11/22 0000 03/11/22 0058 03/11/22 0300 03/11/22 0927  ?BP: (!) 105/55 (!) 105/55 120/76 131/90  ?Pulse: 64 63 65 80  ?Resp:  19 17 (!) 21  ?Temp:  98.8 ?F (37.1 ?C) 98.5 ?F (36.9 ?C) 99 ?F (37.2 ?C)  ?TempSrc:  Oral Oral Oral  ?SpO2:  (!) 87% 93% 94%  ?Weight:   56.4 kg   ?Height:      ? ? ?Intake/Output Summary (Last 24 hours) at 03/11/2022 0955 ?Last data filed at 03/11/2022 0300 ?Gross per 24 hour  ?Intake 757.78 ml  ?Output --  ?Net 757.78 ml  ? ? ?  03/11/2022  ?  3:00 AM 03/10/2022  ? 12:34 PM 03/10/2022  ? 10:23 AM  ?Last 3 Weights  ?Weight (lbs) 124 lb 5.4 oz 119 lb 14.9 oz 120 lb  ?Weight (kg) 56.4 kg 54.4 kg 54.432 kg  ?   ? ?Telemetry  ?  ?SR, some PACs seen, no Afib - Personally Reviewed ? ?ECG  ?  ?SR, HR 68, diffuse T wave changes, some differences from yesterday  - Personally Reviewed ? ?Physical Exam  ? ?GEN: No acute distress.   ?Neck: No JVD ?Cardiac: RRR, no murmurs,  rubs, or gallops.  ?Respiratory: Clear to auscultation bilaterally. ?GI: Soft, nontender, non-distended  ?MS: No edema; No deformity. ?Neuro:  Nonfocal  ?Psych: Normal affect  ? ?Labs  ?  ?High Sensitivity Troponin:   ?Recent Labs  ?Lab 03/10/22 ?1245 03/10/22 ?1430 03/10/22 ?2126 03/11/22 ?0741  ?TROPONINIHS 29* 35* 31* 27*  ?   ?Chemistry ?Recent Labs  ?Lab 03/10/22 ?1245  ?NA 134*  ?K 3.5  ?CL 100  ?CO2 25  ?GLUCOSE 97  ?BUN 5*  ?CREATININE 0.66  ?CALCIUM 9.4  ?MG 1.9  ?PROT 7.5  ?ALBUMIN 3.8  ?AST 111*  ?ALT 71*  ?ALKPHOS 92  ?BILITOT 0.7  ?GFRNONAA >60  ?ANIONGAP 9  ?  ?Lipids  ?Recent Labs  ?Lab 03/11/22 ?0741  ?CHOL 183  ?TRIG 70  ?HDL 67  ?LDLCALC 102*  ?CHOLHDL 2.7  ?  ?Hematology ?Recent Labs  ?Lab 03/10/22 ?1245 03/11/22 ?0741  ?WBC 11.0* 9.4  ?RBC 5.28* 4.84  ?HGB 16.4* 15.2*  ?HCT 49.0* 44.8  ?MCV 92.8 92.6  ?MCH 31.1 31.4  ?MCHC 33.5 33.9  ?RDW 14.0  14.2  ?PLT 270 253  ? ?Thyroid  ?Recent Labs  ?Lab 03/10/22 ?2126  ?TSH 2.181  ?FREET4 0.91  ?  ?BNP ?Recent Labs  ?Lab 03/10/22 ?1245  ?BNP 383.2*  ?  ?DDimer  ?Recent Labs  ?Lab 03/10/22 ?1245  ?DDIMER 0.90*  ?  ? ?Radiology  ?  ?CT Angio Chest PE W and/or Wo Contrast ? ?Result Date: 03/10/2022 ?CLINICAL DATA:  Concern for pulmonary embolus EXAM: CT ANGIOGRAPHY CHEST WITH CONTRAST TECHNIQUE: Multidetector CT imaging of the chest was performed using the standard protocol during bolus administration of intravenous contrast. Multiplanar CT image reconstructions and MIPs were obtained to evaluate the vascular anatomy. RADIATION DOSE REDUCTION: This exam was performed according to the departmental dose-optimization program which includes automated exposure control, adjustment of the mA and/or kV according to patient size and/or use of iterative reconstruction technique. CONTRAST:  164mL OMNIPAQUE IOHEXOL 350 MG/ML SOLN COMPARISON:  None. FINDINGS: Cardiovascular: Adequate contrast opacification of the pulmonary arteries. Mild cardiomegaly. No pericardial  effusion. Normal caliber thoracic aorta with atherosclerotic disease. Left main and three-vessel coronary artery calcifications. Mediastinum/Nodes: Esophagus and thyroid are unremarkable. No pathologically enlarged lymph nodes seen in the chest. Lungs/Pleura: Central airways are patent. No consolidation, pleural effusion or pneumothorax. Upper Abdomen: No acute abnormality. Musculoskeletal: No chest wall abnormality. No acute or significant osseous findings. Review of the MIP images confirms the above findings. IMPRESSION: 1. No evidence of pulmonary embolus. 2. Mild cardiomegaly. 3. Left main and three-vessel coronary artery calcifications. 4.  Aortic Atherosclerosis (ICD10-I70.0). Electronically Signed   By: Yetta Glassman M.D.   On: 03/10/2022 14:44  ? ?DG Chest Portable 1 View ? ?Result Date: 03/10/2022 ?CLINICAL DATA:  Shortness of breath, numbness and tingling in the face EXAM: PORTABLE CHEST 1 VIEW COMPARISON:  None. FINDINGS: The heart size and mediastinal contours are within normal limits. Both lungs are clear. The visualized skeletal structures are unremarkable. IMPRESSION: No active disease. Electronically Signed   By: Kathreen Devoid M.D.   On: 03/10/2022 14:11   ? ?Cardiac Studies  ? ?ECHO: ordered ? ?Patient Profile  ?   ?66 y.o. female with a hx of gout, tobacco use, ETOH use (5-6 beers per day), who has not seen MD is some time, was admitted 03/10/2022 for the evaluation of atrial fib and chest pain.   ? ?Assessment & Plan  ?  ?Chest pain ?- mild elevation in troponin ?- some ECG changes from admit, unclear significance ?- echo pending ?- discussed w/ MD, will order cardiac CT, 18 ga IV and Lopressor 50 mg x 1 ordered ? ?2. MAT (thought to be PAF but was not) ?- seen on admit, spontaneously converted to SR ?- In setting of 3 Excedrin, hx ETOH use and tobacco use ?- lifestyle mods ? ?3. Dyslipidemia ?- LDL 102, other values ok ?- started on Lipitor 80 ?- will recheck LFTs tomorrow as AST/ALT were  elevated at 111/71 on admit, likely 2nd ETOH ? ? ?For questions or updates, please contact [facility] ?Please consult [email or web address] for contact info under  ? ?  ?   ?Signed, ?Rosaria Ferries, PA-C  ?03/11/2022, 9:55 AM   ? ?

## 2022-03-11 NOTE — Progress Notes (Signed)
ANTICOAGULATION CONSULT NOTE ?Pharmacy Consult for heparin ?Indication: atrial fibrillation ?Brief A/P: Heparin level subtherapeutic Increase Heparin rate ? ?Allergies  ?Allergen Reactions  ? Flagyl [Metronidazole] Diarrhea and Nausea And Vomiting  ? ? ?Patient Measurements: ?Height: 5\' 1"  (154.9 cm) ?Weight: 54.4 kg (119 lb 14.9 oz) ?IBW/kg (Calculated) : 47.8 ?Heparin Dosing Weight: 54.4 kg ? ?Vital Signs: ?Temp: 98.6 ?F (37 ?C) (04/24 1942) ?Temp Source: Oral (04/24 1942) ?BP: 105/55 (04/25 0000) ?Pulse Rate: 64 (04/25 0000) ? ?Labs: ?Recent Labs  ?  03/10/22 ?1245 03/10/22 ?1430 03/10/22 ?2126  ?HGB 16.4*  --   --   ?HCT 49.0*  --   --   ?PLT 270  --   --   ?LABPROT 12.9  --   --   ?INR 1.0  --   --   ?HEPARINUNFRC  --   --  0.20*  ?CREATININE 0.66  --   --   ?TROPONINIHS 29* 35* 31*  ? ? ? ?Estimated Creatinine Clearance: 54.3 mL/min (by C-G formula based on SCr of 0.66 mg/dL). ? ?Assessment: ?66 y.o. female with chest pain/Afib for heparin  ? ?Goal of Therapy:  ?Heparin level 0.3-0.7 units/ml ?Monitor platelets by anticoagulation protocol: Yes ?  ?Plan:  ?Heparin 1500 units IV bolus, then increase heparin 950 units/hr ?Check heparin level in 8 hours.  ? ?66, PharmD, BCPS ? ?  ? ? ?

## 2022-03-11 NOTE — Progress Notes (Signed)
?  Transition of Care (TOC) Screening Note ? ? ?Patient Details  ?Name: Jennifer Pennington ?Date of Birth: 01/21/1956 ? ? ?Transition of Care (TOC) CM/SW Contact:    ?Delilah Shan, LCSWA ?Phone Number: ?03/11/2022, 4:29 PM ? ? ? ?Transition of Care Department [facility]) has reviewed patient and no TOC needs have been identified at this time. We will continue to monitor patient advancement through interdisciplinary progression rounds. If new patient transition needs arise, please place a TOC consult. ?  ?

## 2022-03-12 ENCOUNTER — Inpatient Hospital Stay (HOSPITAL_COMMUNITY)
Admit: 2022-03-12 | Discharge: 2022-03-12 | Disposition: A | Discharge status: Home / Self Care | Payer: BLUE CROSS/BLUE SHIELD | Attending: Cardiology | Admitting: Cardiology

## 2022-03-12 ENCOUNTER — Other Ambulatory Visit (HOSPITAL_COMMUNITY): Payer: Self-pay

## 2022-03-12 ENCOUNTER — Encounter: Payer: Self-pay | Admitting: Internal Medicine

## 2022-03-12 DIAGNOSIS — I471 Supraventricular tachycardia: Secondary | ICD-10-CM

## 2022-03-12 DIAGNOSIS — I248 Other forms of acute ischemic heart disease: Principal | ICD-10-CM

## 2022-03-12 DIAGNOSIS — F1010 Alcohol abuse, uncomplicated: Secondary | ICD-10-CM

## 2022-03-12 DIAGNOSIS — R7989 Other specified abnormal findings of blood chemistry: Secondary | ICD-10-CM

## 2022-03-12 DIAGNOSIS — Z720 Tobacco use: Secondary | ICD-10-CM

## 2022-03-12 DIAGNOSIS — I2489 Other forms of acute ischemic heart disease: Secondary | ICD-10-CM

## 2022-03-12 DIAGNOSIS — R931 Abnormal findings on diagnostic imaging of heart and coronary circulation: Secondary | ICD-10-CM

## 2022-03-12 DIAGNOSIS — R030 Elevated blood-pressure reading, without diagnosis of hypertension: Secondary | ICD-10-CM

## 2022-03-12 DIAGNOSIS — E782 Mixed hyperlipidemia: Secondary | ICD-10-CM

## 2022-03-12 DIAGNOSIS — I4719 Other supraventricular tachycardia: Secondary | ICD-10-CM

## 2022-03-12 LAB — HEPATIC FUNCTION PANEL
ALT: 47 U/L — ABNORMAL HIGH (ref 0–44)
AST: 53 U/L — ABNORMAL HIGH (ref 15–41)
Albumin: 3.1 g/dL — ABNORMAL LOW (ref 3.5–5.0)
Alkaline Phosphatase: 66 U/L (ref 38–126)
Bilirubin, Direct: 0.2 mg/dL (ref 0.0–0.2)
Indirect Bilirubin: 0.6 mg/dL (ref 0.3–0.9)
Total Bilirubin: 0.8 mg/dL (ref 0.3–1.2)
Total Protein: 6.2 g/dL — ABNORMAL LOW (ref 6.5–8.1)

## 2022-03-12 LAB — CBC
HCT: 42.5 % (ref 36.0–46.0)
Hemoglobin: 14.7 g/dL (ref 12.0–15.0)
MCH: 32.2 pg (ref 26.0–34.0)
MCHC: 34.6 g/dL (ref 30.0–36.0)
MCV: 93 fL (ref 80.0–100.0)
Platelets: 215 10*3/uL (ref 150–400)
RBC: 4.57 MIL/uL (ref 3.87–5.11)
RDW: 14.3 % (ref 11.5–15.5)
WBC: 9.3 10*3/uL (ref 4.0–10.5)
nRBC: 0 % (ref 0.0–0.2)

## 2022-03-12 LAB — HEPARIN LEVEL (UNFRACTIONATED): Heparin Unfractionated: 0.46 IU/mL (ref 0.30–0.70)

## 2022-03-12 MED ORDER — ASPIRIN 81 MG PO TBEC
81.0000 mg | DELAYED_RELEASE_TABLET | Freq: Every day | ORAL | 11 refills | Status: DC
  Filled 2022-03-12: qty 30, 30d supply, fill #0

## 2022-03-12 MED ORDER — ATORVASTATIN CALCIUM 40 MG PO TABS
40.0000 mg | ORAL_TABLET | Freq: Every day | ORAL | Status: DC
  Filled 2022-03-12: qty 1

## 2022-03-12 MED ORDER — NITROGLYCERIN 0.4 MG SL SUBL
0.4000 mg | SUBLINGUAL_TABLET | SUBLINGUAL | 2 refills | Status: DC | PRN
  Filled 2022-03-12: qty 25, 14d supply, fill #0

## 2022-03-12 MED ORDER — ATORVASTATIN CALCIUM 40 MG PO TABS
40.0000 mg | ORAL_TABLET | Freq: Every day | ORAL | 2 refills | Status: DC
  Filled 2022-03-12: qty 30, 30d supply, fill #0

## 2022-03-12 MED ORDER — NICOTINE 21 MG/24HR TD PT24
21.0000 mg | MEDICATED_PATCH | TRANSDERMAL | 0 refills | Status: AC
  Filled 2022-03-12: qty 28, 28d supply, fill #0

## 2022-03-12 MED ORDER — METOPROLOL TARTRATE 25 MG PO TABS
25.0000 mg | ORAL_TABLET | Freq: Two times a day (BID) | ORAL | 2 refills | Status: DC
  Filled 2022-03-12: qty 60, 30d supply, fill #0

## 2022-03-12 NOTE — Discharge Instructions (Signed)
Medication Changes: ?- START Aspirin 81mg  daily. ?- START Lipitor 40mg  daily. ?- START Metoprolol tartrate 25mg  twice daily. ?- STOP Excedrin Migraine. Recommended over-the-counter Tylenol as needed for pain instead. ?- Would also recommend stopping your over-counter supplements (THC Delta 8 and Kratom) as they are not FDA approved. ? ? ?

## 2022-03-12 NOTE — Progress Notes (Signed)
ANTICOAGULATION CONSULT NOTE ?Pharmacy Consult for heparin ?Indication: r/o ACS ? ? ?Allergies  ?Allergen Reactions  ? Flagyl [Metronidazole] Diarrhea and Nausea And Vomiting  ? ? ?Patient Measurements: ?Height: 5\' 1"  (154.9 cm) ?Weight: 56.4 kg (124 lb 5.4 oz) ?IBW/kg (Calculated) : 47.8 ?Heparin Dosing Weight: 54.4 kg ? ?Vital Signs: ?Temp: 98.4 ?F (36.9 ?C) (04/26 09-17-1990) ?Temp Source: Oral (04/26 09-17-1990) ?BP: 136/89 (04/26 0816) ?Pulse Rate: 64 (04/26 0816) ? ?Labs: ?Recent Labs  ?  03/10/22 ?1245 03/10/22 ?1430 03/10/22 ?2126 03/11/22 ?0741 03/12/22 ?0402  ?HGB 16.4*  --   --  15.2* 14.7  ?HCT 49.0*  --   --  44.8 42.5  ?PLT 270  --   --  253 215  ?LABPROT 12.9  --   --   --   --   ?INR 1.0  --   --   --   --   ?HEPARINUNFRC  --   --  0.20* 0.53 0.46  ?CREATININE 0.66  --   --   --   --   ?TROPONINIHS 29* 35* 31* 27*  --   ? ? ? ?Estimated Creatinine Clearance: 54.3 mL/min (by C-G formula based on SCr of 0.66 mg/dL). ? ?Assessment: ?66 y.o. female with chest pain on heparin. She is s/p cardiac CTA ?-heparin level at goal on 950 units/hr ?-CBC stable ? ?Goal of Therapy:  ?Heparin level 0.3-0.7 units/ml ?Monitor platelets by anticoagulation protocol: Yes ?  ?Plan:  ?-Continue heparin at 950 units/hr ?-Will follow procedural plans ?-Daily heparin level and CBC ? ?66, PharmD ?Clinical Pharmacist ?**Pharmacist phone directory can now be found on [email or web address] ([email or web address]).  Listed under [facility]. ? ? ? ?  ? ? ?

## 2022-03-12 NOTE — Discharge Summary (Signed)
?Discharge Summary  ?  ?Patient ID: Jennifer Pennington ?MRN: [id number]; DOB: October 17, 1956 ? ?Admit date: 03/10/2022 ?Discharge date: 03/12/2022 ? ?PCP:  No primary care provider on file. ?  ?[facility] Providers ?Cardiologist:  Pixie Casino, MD  ? ?Discharge Diagnoses  ?  ?Principal Problem: ?  Chest pain at rest ?Active Problems: ?  Hyperlipidemia ?  CAD (coronary artery disease) ?  Demand ischemia ([place]) ?  Multifocal atrial tachycardia (HCC) ?  Elevated BP without diagnosis of hypertension ?  Tobacco abuse ?  Alcohol abuse ?  Elevated LFTs ? ? ? ?Diagnostic Studies/Procedures  ?  ?Chest CTA 03/10/2022: ?Impression: ?1. No evidence of pulmonary embolus. ?2. Mild cardiomegaly. ?3. Left main and three-vessel coronary artery calcifications. ?4.  Aortic Atherosclerosis (ICD10-I70.0). ?_______________ ? ?Coronary CTA 03/11/2022: ?Impressions: ?1. Coronary calcium score of 1211. This was 44 percentile for age ?and sex matched control. ?2. Normal coronary origin with right dominance. ?3. Diffuse RCA mixed calcified and non calcified plaque (25-49%) ?with FFR 0.88 (non flow limiting). ?4. Distal small branch OM stenosis 75-99% (appears to small for PCI) ?5.  Ostial left main mild calcified plaque (no stenosis) ?6.  Aortic atherosclerosis. ?7. Continue with secondary medical prevention. If symptoms worsen or become more worrisome, consider angiogram. ? ?FFR Summary: ?1.  No flow limiting coronary artery disease ?_______________ ? ?Echocardiogram 03/11/2022: ?Impressions: ?1. Left ventricular ejection fraction, by estimation, is 55 to 60%. The  ?left ventricle has normal function. The left ventricle demonstrates  ?regional wall motion abnormalities (lateral hypokinesis). There is severe  ?concentric left ventricular  ?hypertrophy. Left ventricular diastolic parameters are consistent with  ?Grade III diastolic dysfunction (restrictive).  ? 2. Right ventricular systolic function is normal. The right ventricular  ?size is  normal. There is normal pulmonary artery systolic pressure. The  ?estimated right ventricular systolic pressure is 60.6 mmHg.  ? 3. Left atrial size was moderately dilated.  ? 4. The mitral valve is grossly normal. Mild mitral valve regurgitation.  ?No evidence of mitral stenosis.  ? 5. The aortic valve is tricuspid. Aortic valve regurgitation is not  ?visualized. No aortic stenosis is present.  ? 6. The inferior vena cava is normal in size with greater than 50%  ?respiratory variability, suggesting right atrial pressure of 3 mmHg.  ? ?History of Present Illness   ?  ?Jennifer Pennington is a 66 y.o. female with a history of gout, tobacco abuse, and alcohol abuse but no known cardiac disease prior to admission who was admitted on 03/10/2022 with chest pain and palpitations.  Patient has not been seen by a physician in some time.  She presented to urgent care on 03/06/2022 for further evaluation of chest pain, shortness of breath, and elevated BP.  She reported having chest pain for some time but it does not last long.  However, she woke up on the morning of presentation from sleep around 1 AM with shortness of breath and chest pain.  The severity of the pain would wax and wane but she could not go back to sleep.  She also reported palpitations with this as well as some associated shortness of breath and nausea.  She states it initially felt like a severe "charley horse" and then more like a "hug" on her chest.  She then had some chest pressure that felt like an elephant sitting on her chest.  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  She states her coworkers stated she passed out but sounds like this may have just been a presyncopal episode.  EMS was called and upon their arrival she was noted to be tachycardic.  EKG was concerning for atrial fibrillation and she was on IV Cardizem as well as sublingual  nitroglycerin with some improvement in her chest pain.  ? ?EKG and telemetry on arrival is more consistent with MAT versus sinus tachycardia with PACs.  High-sensitivity troponin was minimally elevated and flat at 29 >> 35 >> 31 >> 27 not consistent with ACS. BNP elevated at 383. Chest x-ray showed no acute findings. D-dimer 0.90. Chest CTA negative for PE but did show left main and 3-vessel coronary artery calcifications. CBC 11.0, Hgb 16.4, Plts 270. Na 134, K 3.5, Glucose 97, Cr 0.66, BUN 5. Total  Protein 7.5, Albumin 3.8, AST 111, ALT 71, Alk Phos 92, Total Bili 0.7. Magnesium 1.9. She was started on IV Heparin and admitted for further evaluation. ? ? ? ?Hospital Course  ?   ?Consultants: None  ? ?Chest Pain ?CAD ?Patient presented with chest pain that woke her up from sleep and was associated with shortness of breath and nausea as well as palpitations.  High-sensitivity troponin minimally elevated and flat peaking at 35 which is not consistent with ACS.  D-dimer was positive so chest CTA was ordered and was negative for PE but did show left main and three-vessel coronary artery calcifications. Echo showed LVEF of 55-60% with lateral hypokinesis and grade 3 diastolic dysfunction as well as mild MR. She underwent coronary CTA which showed an elevated coronary calcium score of 1211 (99th percentile for age and sex) with diffuse calcified and noncalcified plaque (25-49%), distal small branch of OM disease (75-99%) and a vessel that of peers too small for PCI, and mild calcified plaque of the ostial left main with no stenosis.  FFR was sent and showed no flow-limiting CAD.  Therefore, medical therapy was recommended.  Troponin elevation was felt to be due to to demand ischemia from tachyarrhythmia.  She will be discharged on Aspirin 39m daily, Lipitor 430mdaily, and Lopressor 2580mwice daily. ? ?MAT ?Patient was noted to be tachycardic on arrival.  There was concern for atrial fibrillation in the field but upon  our review was felt to be more consistent with MAT.  Occurred in the setting of taking multiple doses of Excedrin.  She was also noted to have short runs of SVT.  She was started on IV Cardizem as well as PO Lopressor with improvement in rates.  IV Cardizem was able to be weaned.  She will be discharged on Lopressor 25 mg twice daily. ? ?Elevated BP without Diagnosis of Hypertension ?BP markedly elevated on arrival as high as the 180s/100s.  No known diagnosis of hypertension prior to this admission but patient has not seen a physician in some time.  She was started on rate control agents as above and BP improved.  BP is actually been soft at times over the last 24 hours with BP in the 100s/50s.  She will be discharged on Lopressor alone.  If BP elevated at follow-up visit, can consider adding losartan. ? ?Hyperlipidemia ?Lipid panel this admission: Total Cholesterol 183, Triglycerides 70, HDL 67, LDL 102. LDL goal <70 given CAD. Started on Lipitor 5m38mily. Will need repeat lipid panel and LFTs in 6-8 weeks. ? ?Tobacco Abuse ?Alcohol Abuse ?Patient has a 50 pack year smoking history  and currently smokes 1 pack per day. She also typically drinks 5-6 beers per day. She was started on CIWA protocol and counseled on the importance of complete cessation. Will provide Nicotine patches at discharge. Patient also takes over-the-counter THC Delta 8 and Kratom (herbal supplement that can produce opioid and stimulant like effects). Recommended stopping both of these. ? ?Elevated LFTs ?AST and ALT mildly elevated at 111 and 71 respectively on admission. Otherwise, LFTs normal. Patient was counseled on her alcohol use as above. Will repeat CMET at follow-up given addition of Lipitor.  ? ?Patient was seen and examined by Dr. Debara Pickett today and determined to be stable for discharge. Outpatient follow-up arranged. Medications as below. ? ?  ?Did the patient have an acute coronary syndrome (MI, NSTEMI, STEMI, etc) this admission?:   No.   ?The elevated Troponin was due to the acute medical illness (demand ischemia).  ? ?_____________ ? ?Discharge Vitals ?Blood pressure 136/89, pulse 64, temperature 98.4 ?F (36.9 ?C), temperature source Oral,

## 2022-03-12 NOTE — Plan of Care (Signed)

## 2022-03-12 NOTE — Telephone Encounter (Signed)
Sent message to discharging  provider for  answer about  returning to work note ?

## 2022-03-12 NOTE — Telephone Encounter (Signed)
Sorry about that [employer] We didn't realize she needed a work note. I talked with Dr. Rennis Golden. Her coronary CTA showed only non-obstructive CAD and FFR was negative for any flow limiting stenosis so she can go back to work tomorrow. ? ?Thank you! ?

## 2022-03-13 NOTE — Telephone Encounter (Signed)
Sent  work note by [employer] sent message patient can return to work on 03/14/22 ?

## 2022-03-23 NOTE — Progress Notes (Deleted)
Cardiology Office Note:    Date:  04/01/2022   ID:  Jennifer Pennington, DOB 05-09-1956, MRN [id number]  PCP:  No primary care provider on file.  Cardiologist:  Chrystie NoseKenneth C Hilty, MD  Electrophysiologist:  None   Referring MD: No ref. provider found   Chief Complaint: hospital follow-up of chest pain and MAT  History of Present Illness:    Jennifer Pennington is a 66 y.o. female with a history of non-obstructive CAD noted on recent coronary CTA on 03/11/2022, MAT, hyperlipidemia, gout, alcohol abuse, and tobacco abuse who is followed by Dr. Rennis GoldenHilty and presents today for hospital follow-up of chest pain and MAT.  Patient was first seen by Dr. Rennis GoldenHilty during recent hospitalization. She was admitted from 03/10/2022 to 03/12/2022 after presenting with chest pain, shortness of breath, and palpitations. She was noted ot be tachycardic on arrival. There was concern for atrial fibrillation in the field but upon review by Cardiology was felt to be more consistent with MAT. This occurred in the setting of taking multiple doses of Excedrin. She was also noted to have short runs of SVT. She was started on IV Cardizem and PO Lopressor with improvement. High-sensitivity troponin was minimally elevated and flat peaking at 35 which is not consistent with ACS.  D-dimer was positive so chest CTA was ordered and was negative for PE but did show left main and three-vessel coronary artery calcifications. Echo showed LVEF of 55-60% with lateral hypokinesis and grade 3 diastolic dysfunction as well as mild MR. She underwent coronary CTA which showed an elevated coronary calcium score of 1211 (99th percentile for age and sex) with diffuse calcified and noncalcified plaque (25-49%), distal small branch of OM disease (75-99%) and a vessel that of peers too small for PCI, and mild calcified plaque of the ostial left main with no stenosis.  FFR was sent and showed no flow-limiting CAD.  Therefore, medical therapy was recommended.  Troponin  elevation was felt to be due to to demand ischemia from tachyarrhythmia.  She will be discharged on Aspirin 81mg  daily, Lipitor 40mg  daily (given LDL of 102), and Lopressor 25mg  twice daily. She was counseled on the importance of alcohol and tobacco cessation.  Patient presents today for follow-up. ***  Non-Obstructive CAD Patient was recently admitted with chest pain. Coronary CTA showed an elevated coronary calcium score of 1211 (99th percentile for age and sex) with diffuse calcified and noncalcified plaque (25-49%), distal small branch of OM disease (75-99%) and a vessel that of peers too small for PCI, and mild calcified plaque of the ostial left main with no stenosis. FFR was sent and showed no flow-limiting CAD.  Therefore, medical therapy was recommended.  - No recurrent chest pain.  - Continue Aspirin 81mg  daily, Lopressor 25mg  twice daily, and Lipitor 40mg  daily.  MAT Paroxysmal SVT Noted to have MAT and short runs of SVT during recent admission.  - Stable. No current palpitations.  - Continue Lopressor 25mg  twice daily.  Hyperlipidemia Lipid panel during recent admission: Total Cholesterol 183, Triglycerides 70, HDL 67, LDL 102. LDL goal <70 given CAD.  - Started on Lipitor 40mg  daily during admission. Continue.  - Will repeat CMET today given mildly elevated transaminases during recent admission. Will then need repeat lipid panel and LFTS in 2 months. ***  Alcohol Abuse Tobacco Abuse ***  Past Medical History:  Diagnosis Date   Alcohol abuse    Arthritis    Elevated LFTs    Gout    Hyperlipidemia  Multifocal atrial tachycardia (HCC)    Non-obstructive CAD    a. Coronary CTA 02/2022:Coronary calcium score of 1,211 (99th percentile for age and sex) with diffuse calcified and noncalcified plaque (25-49%), distal small branch of OM disease (75-99%) and a vessel that of peers too small for PCI, and mild calcified plaque of the ostial left main with no stenosis. FFR was sent  and showed no flow-limiting CAD.  Therefore, medical therapy was recommended.   Paroxysmal SVT (supraventricular tachycardia) (HCC)    Tobacco abuse     Past Surgical History:  Procedure Laterality Date   TUBAL LIGATION      Current Medications: No outpatient medications have been marked as taking for the 04/02/22 encounter (Appointment) with Corrin Parker, PA-C.     Allergies:   Flagyl [metronidazole]   Social History   Socioeconomic History   Marital status: Married    Spouse name: Not on file   Number of children: Not on file   Years of education: Not on file   Highest education level: Not on file  Occupational History   Not on file  Tobacco Use   Smoking status: Every Day    Packs/day: 1.00    Types: Cigarettes   Smokeless tobacco: Never  Substance and Sexual Activity   Alcohol use: Yes    Alcohol/week: 35.0 standard drinks    Types: 35 Cans of beer per week   Drug use: Never   Sexual activity: Not on file  Other Topics Concern   Not on file  Social History Narrative   Not on file   Social Determinants of Health   Financial Resource Strain: Not on file  Food Insecurity: Not on file  Transportation Needs: Not on file  Physical Activity: Not on file  Stress: Not on file  Social Connections: Not on file     Family History: The patient's family history includes Heart disease in her father.  ROS:   Please see the history of present illness.     EKGs/Labs/Other Studies Reviewed:    The following studies were reviewed today:  Coronary CTA 03/11/2022: Impressions: 1. Coronary calcium score of 1211. This was 10 percentile for age and sex matched control. 2. Normal coronary origin with right dominance. 3. Diffuse RCA mixed calcified and non calcified plaque (25-49%) with FFR 0.88 (non flow limiting). 4. Distal small branch OM stenosis 75-99% (appears to small for PCI) 5.  Ostial left main mild calcified plaque (no stenosis) 6.  Aortic  atherosclerosis. 7. Continue with secondary medical prevention. If symptoms worsen or become more worrisome, consider angiogram.   FFR Summary: 1.  No flow limiting coronary artery disease _______________   Echocardiogram 03/11/2022: Impressions: 1. Left ventricular ejection fraction, by estimation, is 55 to 60%. The  left ventricle has normal function. The left ventricle demonstrates  regional wall motion abnormalities (lateral hypokinesis). There is severe  concentric left ventricular  hypertrophy. Left ventricular diastolic parameters are consistent with  Grade III diastolic dysfunction (restrictive).   2. Right ventricular systolic function is normal. The right ventricular  size is normal. There is normal pulmonary artery systolic pressure. The  estimated right ventricular systolic pressure is 25.8 mmHg.   3. Left atrial size was moderately dilated.   4. The mitral valve is grossly normal. Mild mitral valve regurgitation.  No evidence of mitral stenosis.   5. The aortic valve is tricuspid. Aortic valve regurgitation is not  visualized. No aortic stenosis is present.   6. The inferior vena  cava is normal in size with greater than 50%  respiratory variability, suggesting right atrial pressure of 3 mmHg.     EKG:  EKG not ordered today. EKG personally reviewed and demonstrates ***.  Recent Labs: 03/10/2022: B Natriuretic Peptide 383.2; BUN 5; Creatinine, Ser 0.66; Magnesium 1.9; Potassium 3.5; Sodium 134; TSH 2.181 03/12/2022: ALT 47; Hemoglobin 14.7; Platelets 215  Recent Lipid Panel    Component Value Date/Time   CHOL 183 03/11/2022 0741   TRIG 70 03/11/2022 0741   HDL 67 03/11/2022 0741   CHOLHDL 2.7 03/11/2022 0741   VLDL 14 03/11/2022 0741   LDLCALC 102 (H) 03/11/2022 0741   LDLDIRECT 154.0 01/08/2010 0931    Physical Exam:    Vital Signs: There were no vitals taken for this visit.    Wt Readings from Last 3 Encounters:  03/11/22 124 lb 5.4 oz (56.4 kg)  03/10/22  120 lb (54.4 kg)     General: 66 y.o. female in no acute distress. HEENT: Normocephalic and atraumatic. Sclera clear. EOMs intact. Neck: Supple. No carotid bruits. No JVD. Heart: *** RRR. Distinct S1 and S2. No murmurs, gallops, or rubs. Radial and distal pedal pulses 2+ and equal bilaterally. Lungs: No increased work of breathing. Clear to ausculation bilaterally. No wheezes, rhonchi, or rales.  Abdomen: Soft, non-distended, and non-tender to palpation. Bowel sounds present in all 4 quadrants.  MSK: Normal strength and tone for age. *** Extremities: No lower extremity edema.    Skin: Warm and dry. Neuro: Alert and oriented x3. No focal deficits. Psych: Normal affect. Responds appropriately.   Assessment:    No diagnosis found.  Plan:     Disposition: Follow up in ***   Medication Adjustments/Labs and Tests Ordered: Current medicines are reviewed at length with the patient today.  Concerns regarding medicines are outlined above.  No orders of the defined types were placed in this encounter.  No orders of the defined types were placed in this encounter.   There are no Patient Instructions on file for this visit.   Signed, Corrin Parker, PA-C  04/01/2022 1:06 PM    [facility]

## 2022-04-01 ENCOUNTER — Encounter: Payer: Self-pay | Admitting: Student

## 2022-04-02 ENCOUNTER — Ambulatory Visit: Payer: BLUE CROSS/BLUE SHIELD | Admitting: Student

## 2022-04-07 ENCOUNTER — Telehealth: Payer: Self-pay | Admitting: Internal Medicine

## 2022-04-07 ENCOUNTER — Encounter: Payer: Self-pay | Admitting: Student

## 2022-04-07 NOTE — Telephone Encounter (Signed)
Spoke to patient, patient reports needed work letter stating when she was in the hospital and that she could return to work on 4/28 without restrictions.     Per previous encounter-letter created but do not see return to work letter.   Patient also no showed to her follow up 5/17-states the GPS was mapped somewhere differently so she missed this appt.    Will confirm with provider ok to create letter for hospitalization and return to work note.

## 2022-04-07 NOTE — Telephone Encounter (Signed)
Patient needs a fax sent in to patients job stating that she can go back to work. Please call back to confirm. Fax # [phone]; Salome ArntJamesetta Orleans

## 2022-04-08 NOTE — Telephone Encounter (Signed)
It's fine to provide a letter from recent hospitalization and return to work on 03/14/2022 without any restrictions. Thank you!

## 2022-04-10 NOTE — Telephone Encounter (Signed)
Letter created and faxed to # provided.    Left message for patient to call back to make aware.

## 2022-04-11 ENCOUNTER — Other Ambulatory Visit (HOSPITAL_COMMUNITY): Payer: Self-pay

## 2022-06-02 ENCOUNTER — Ambulatory Visit: Payer: BLUE CROSS/BLUE SHIELD | Admitting: Nurse Practitioner

## 2022-06-05 ENCOUNTER — Other Ambulatory Visit: Payer: Self-pay | Admitting: Student

## 2022-07-07 ENCOUNTER — Other Ambulatory Visit: Payer: Self-pay | Admitting: Student

## 2022-09-06 ENCOUNTER — Other Ambulatory Visit: Payer: Self-pay | Admitting: Student

## 2023-03-08 ENCOUNTER — Inpatient Hospital Stay (HOSPITAL_COMMUNITY)
Admission: EM | Admit: 2023-03-08 | Discharge: 2023-03-12 | DRG: 308 | Disposition: A | Discharge status: Home / Self Care | Payer: BLUE CROSS/BLUE SHIELD | Attending: Internal Medicine | Admitting: Internal Medicine

## 2023-03-08 ENCOUNTER — Emergency Department (HOSPITAL_COMMUNITY): Payer: BLUE CROSS/BLUE SHIELD

## 2023-03-08 ENCOUNTER — Encounter (HOSPITAL_COMMUNITY): Payer: Self-pay | Admitting: Emergency Medicine

## 2023-03-08 ENCOUNTER — Other Ambulatory Visit: Payer: Self-pay

## 2023-03-08 DIAGNOSIS — Z7901 Long term (current) use of anticoagulants: Secondary | ICD-10-CM | POA: Diagnosis not present

## 2023-03-08 DIAGNOSIS — E785 Hyperlipidemia, unspecified: Secondary | ICD-10-CM | POA: Diagnosis present

## 2023-03-08 DIAGNOSIS — I5021 Acute systolic (congestive) heart failure: Secondary | ICD-10-CM | POA: Diagnosis present

## 2023-03-08 DIAGNOSIS — I484 Atypical atrial flutter: Secondary | ICD-10-CM

## 2023-03-08 DIAGNOSIS — I2489 Other forms of acute ischemic heart disease: Secondary | ICD-10-CM | POA: Diagnosis present

## 2023-03-08 DIAGNOSIS — Z881 Allergy status to other antibiotic agents status: Secondary | ICD-10-CM

## 2023-03-08 DIAGNOSIS — F32A Depression, unspecified: Secondary | ICD-10-CM | POA: Diagnosis present

## 2023-03-08 DIAGNOSIS — I700 Atherosclerosis of aorta: Secondary | ICD-10-CM | POA: Diagnosis present

## 2023-03-08 DIAGNOSIS — I4892 Unspecified atrial flutter: Secondary | ICD-10-CM | POA: Diagnosis present

## 2023-03-08 DIAGNOSIS — I5032 Chronic diastolic (congestive) heart failure: Secondary | ICD-10-CM | POA: Insufficient documentation

## 2023-03-08 DIAGNOSIS — F419 Anxiety disorder, unspecified: Secondary | ICD-10-CM | POA: Diagnosis present

## 2023-03-08 DIAGNOSIS — I2510 Atherosclerotic heart disease of native coronary artery without angina pectoris: Secondary | ICD-10-CM | POA: Diagnosis present

## 2023-03-08 DIAGNOSIS — R002 Palpitations: Secondary | ICD-10-CM | POA: Diagnosis present

## 2023-03-08 DIAGNOSIS — I501 Left ventricular failure: Secondary | ICD-10-CM | POA: Insufficient documentation

## 2023-03-08 DIAGNOSIS — Z7982 Long term (current) use of aspirin: Secondary | ICD-10-CM | POA: Diagnosis not present

## 2023-03-08 DIAGNOSIS — Z79899 Other long term (current) drug therapy: Secondary | ICD-10-CM

## 2023-03-08 DIAGNOSIS — Z91148 Patient's other noncompliance with medication regimen for other reason: Secondary | ICD-10-CM

## 2023-03-08 DIAGNOSIS — I4891 Unspecified atrial fibrillation: Principal | ICD-10-CM | POA: Insufficient documentation

## 2023-03-08 DIAGNOSIS — F17210 Nicotine dependence, cigarettes, uncomplicated: Secondary | ICD-10-CM | POA: Diagnosis present

## 2023-03-08 DIAGNOSIS — Z7984 Long term (current) use of oral hypoglycemic drugs: Secondary | ICD-10-CM

## 2023-03-08 DIAGNOSIS — I4719 Other supraventricular tachycardia: Secondary | ICD-10-CM | POA: Diagnosis present

## 2023-03-08 DIAGNOSIS — R072 Precordial pain: Secondary | ICD-10-CM

## 2023-03-08 DIAGNOSIS — Z8249 Family history of ischemic heart disease and other diseases of the circulatory system: Secondary | ICD-10-CM | POA: Diagnosis not present

## 2023-03-08 DIAGNOSIS — I5033 Acute on chronic diastolic (congestive) heart failure: Secondary | ICD-10-CM | POA: Insufficient documentation

## 2023-03-08 DIAGNOSIS — I4811 Longstanding persistent atrial fibrillation: Secondary | ICD-10-CM | POA: Diagnosis not present

## 2023-03-08 DIAGNOSIS — I4819 Other persistent atrial fibrillation: Secondary | ICD-10-CM | POA: Diagnosis present

## 2023-03-08 DIAGNOSIS — I110 Hypertensive heart disease with heart failure: Secondary | ICD-10-CM | POA: Diagnosis present

## 2023-03-08 LAB — CBC WITH DIFFERENTIAL/PLATELET
Abs Immature Granulocytes: 0.05 10*3/uL (ref 0.00–0.07)
Basophils Absolute: 0.1 10*3/uL (ref 0.0–0.1)
Basophils Relative: 1 %
Eosinophils Absolute: 0.1 10*3/uL (ref 0.0–0.5)
Eosinophils Relative: 1 %
HCT: 49.1 % — ABNORMAL HIGH (ref 36.0–46.0)
Hemoglobin: 16.2 g/dL — ABNORMAL HIGH (ref 12.0–15.0)
Immature Granulocytes: 0 %
Lymphocytes Relative: 30 %
Lymphs Abs: 3.3 10*3/uL (ref 0.7–4.0)
MCH: 30.8 pg (ref 26.0–34.0)
MCHC: 33 g/dL (ref 30.0–36.0)
MCV: 93.3 fL (ref 80.0–100.0)
Monocytes Absolute: 1.1 10*3/uL — ABNORMAL HIGH (ref 0.1–1.0)
Monocytes Relative: 10 %
Neutro Abs: 6.6 10*3/uL (ref 1.7–7.7)
Neutrophils Relative %: 58 %
Platelets: 297 10*3/uL (ref 150–400)
RBC: 5.26 MIL/uL — ABNORMAL HIGH (ref 3.87–5.11)
RDW: 14.6 % (ref 11.5–15.5)
WBC: 11.2 10*3/uL — ABNORMAL HIGH (ref 4.0–10.5)
nRBC: 0 % (ref 0.0–0.2)

## 2023-03-08 LAB — URINALYSIS, ROUTINE W REFLEX MICROSCOPIC
Bacteria, UA: NONE SEEN
Bilirubin Urine: NEGATIVE
Glucose, UA: NEGATIVE mg/dL
Ketones, ur: NEGATIVE mg/dL
Leukocytes,Ua: NEGATIVE
Nitrite: NEGATIVE
Protein, ur: NEGATIVE mg/dL
Specific Gravity, Urine: 1.004 — ABNORMAL LOW (ref 1.005–1.030)
pH: 7 (ref 5.0–8.0)

## 2023-03-08 LAB — HEPARIN LEVEL (UNFRACTIONATED)
Heparin Unfractionated: 0.7 IU/mL (ref 0.30–0.70)
Heparin Unfractionated: 0.2 IU/mL — ABNORMAL LOW (ref 0.30–0.70)

## 2023-03-08 LAB — COMPREHENSIVE METABOLIC PANEL
ALT: 33 U/L (ref 0–44)
AST: 43 U/L — ABNORMAL HIGH (ref 15–41)
Albumin: 3.6 g/dL (ref 3.5–5.0)
Alkaline Phosphatase: 112 U/L (ref 38–126)
Anion gap: 17 — ABNORMAL HIGH (ref 5–15)
BUN: 6 mg/dL — ABNORMAL LOW (ref 8–23)
CO2: 24 mmol/L (ref 22–32)
Calcium: 9.7 mg/dL (ref 8.9–10.3)
Chloride: 94 mmol/L — ABNORMAL LOW (ref 98–111)
Creatinine, Ser: 0.71 mg/dL (ref 0.44–1.00)
GFR, Estimated: >60 mL/min (ref >60)
Glucose, Bld: 87 mg/dL (ref 70–99)
Potassium: 4.4 mmol/L (ref 3.5–5.1)
Sodium: 135 mmol/L (ref 135–145)
Total Bilirubin: 0.8 mg/dL (ref 0.3–1.2)
Total Protein: 7.5 g/dL (ref 6.5–8.1)

## 2023-03-08 LAB — I-STAT CHEM 8, ED
BUN: 6 mg/dL — ABNORMAL LOW (ref 8–23)
Calcium, Ion: 1.12 mmol/L — ABNORMAL LOW (ref 1.15–1.40)
Chloride: 96 mmol/L — ABNORMAL LOW (ref 98–111)
Creatinine, Ser: 0.6 mg/dL (ref 0.44–1.00)
Glucose, Bld: 95 mg/dL (ref 70–99)
HCT: 47 % — ABNORMAL HIGH (ref 36.0–46.0)
Hemoglobin: 16 g/dL — ABNORMAL HIGH (ref 12.0–15.0)
Potassium: 3.7 mmol/L (ref 3.5–5.1)
Sodium: 132 mmol/L — ABNORMAL LOW (ref 135–145)
TCO2: 27 mmol/L (ref 22–32)

## 2023-03-08 LAB — TROPONIN I (HIGH SENSITIVITY)
Troponin I (High Sensitivity): 53 ng/L — ABNORMAL HIGH (ref <18)
Troponin I (High Sensitivity): 54 ng/L — ABNORMAL HIGH (ref <18)

## 2023-03-08 LAB — PROTIME-INR
INR: 1.1 (ref 0.8–1.2)
Prothrombin Time: 14 seconds (ref 11.4–15.2)

## 2023-03-08 LAB — TSH: TSH: 2.183 u[IU]/mL (ref 0.350–4.500)

## 2023-03-08 LAB — MAGNESIUM: Magnesium: 1.8 mg/dL (ref 1.7–2.4)

## 2023-03-08 MED ORDER — HEPARIN (PORCINE) 25000 UT/250ML-% IV SOLN
1050.0000 [IU]/h | INTRAVENOUS | Status: DC
  Administered 2023-03-08: 850 [IU]/h via INTRAVENOUS
  Filled 2023-03-08 (×2): qty 250

## 2023-03-08 MED ORDER — LISINOPRIL 5 MG PO TABS
5.0000 mg | ORAL_TABLET | Freq: Every day | ORAL | Status: DC
  Administered 2023-03-08 – 2023-03-10 (×3): 5 mg via ORAL
  Filled 2023-03-08 (×3): qty 1

## 2023-03-08 MED ORDER — METOPROLOL TARTRATE 25 MG PO TABS: 25.0000 mg | ORAL_TABLET | Freq: Two times a day (BID) | ORAL | Status: DC

## 2023-03-08 MED ORDER — ASPIRIN 81 MG PO TBEC
81.0000 mg | DELAYED_RELEASE_TABLET | Freq: Every day | ORAL | Status: DC
  Administered 2023-03-08 – 2023-03-12 (×5): 81 mg via ORAL
  Filled 2023-03-08 (×5): qty 1

## 2023-03-08 MED ORDER — CARVEDILOL 3.125 MG PO TABS: 3.1250 mg | ORAL_TABLET | Freq: Two times a day (BID) | ORAL | Status: DC

## 2023-03-08 MED ORDER — POTASSIUM CHLORIDE CRYS ER 20 MEQ PO TBCR
20.0000 meq | EXTENDED_RELEASE_TABLET | Freq: Once | ORAL | Status: AC
  Administered 2023-03-08: 20 meq via ORAL
  Filled 2023-03-08: qty 1

## 2023-03-08 MED ORDER — FUROSEMIDE 10 MG/ML IJ SOLN: 40.0000 mg | Freq: Once | INTRAMUSCULAR | Status: DC

## 2023-03-08 MED ORDER — DILTIAZEM LOAD VIA INFUSION
15.0000 mg | Freq: Once | INTRAVENOUS | Status: AC
  Administered 2023-03-08: 15 mg via INTRAVENOUS
  Filled 2023-03-08: qty 15

## 2023-03-08 MED ORDER — SODIUM CHLORIDE 0.9 % IV SOLN: 250.0000 mL | INTRAVENOUS | Status: DC | PRN

## 2023-03-08 MED ORDER — DILTIAZEM HCL-DEXTROSE 125-5 MG/125ML-% IV SOLN (PREMIX)
5.0000 mg/h | INTRAVENOUS | Status: DC
  Administered 2023-03-08: 10 mg/h via INTRAVENOUS
  Administered 2023-03-08: 15 mg/h via INTRAVENOUS
  Administered 2023-03-08 (×2): 5 mg/h via INTRAVENOUS
  Filled 2023-03-08 (×2): qty 125

## 2023-03-08 MED ORDER — SODIUM CHLORIDE 0.9% FLUSH
3.0000 mL | Freq: Two times a day (BID) | INTRAVENOUS | Status: DC
  Administered 2023-03-09 – 2023-03-12 (×6): 3 mL via INTRAVENOUS

## 2023-03-08 MED ORDER — HEPARIN BOLUS VIA INFUSION
3000.0000 [IU] | Freq: Once | INTRAVENOUS | Status: AC
  Administered 2023-03-08: 3000 [IU] via INTRAVENOUS
  Filled 2023-03-08: qty 3000

## 2023-03-08 MED ORDER — METOPROLOL TARTRATE 50 MG PO TABS
50.0000 mg | ORAL_TABLET | Freq: Two times a day (BID) | ORAL | Status: DC
  Administered 2023-03-08 – 2023-03-11 (×6): 50 mg via ORAL
  Filled 2023-03-08: qty 1
  Filled 2023-03-08 (×2): qty 2
  Filled 2023-03-08 (×3): qty 1

## 2023-03-08 MED ORDER — SODIUM CHLORIDE 0.9% FLUSH: 3.0000 mL | INTRAVENOUS | Status: DC | PRN

## 2023-03-08 MED ORDER — FUROSEMIDE 10 MG/ML IJ SOLN
40.0000 mg | Freq: Every day | INTRAMUSCULAR | Status: DC
  Administered 2023-03-08 – 2023-03-11 (×4): 40 mg via INTRAVENOUS
  Filled 2023-03-08 (×4): qty 4

## 2023-03-08 MED ORDER — ONDANSETRON HCL 4 MG/2ML IJ SOLN: 4.0000 mg | Freq: Four times a day (QID) | INTRAMUSCULAR | Status: DC | PRN

## 2023-03-08 MED ORDER — IPRATROPIUM BROMIDE 0.02 % IN SOLN
0.5000 mg | Freq: Four times a day (QID) | RESPIRATORY_TRACT | Status: DC
  Administered 2023-03-08 – 2023-03-10 (×5): 0.5 mg via RESPIRATORY_TRACT
  Filled 2023-03-08 (×5): qty 2.5

## 2023-03-08 MED ORDER — DIGOXIN 0.25 MG/ML IJ SOLN
0.2500 mg | Freq: Once | INTRAMUSCULAR | Status: AC
  Administered 2023-03-08: 0.25 mg via INTRAVENOUS
  Filled 2023-03-08: qty 2

## 2023-03-08 MED ORDER — ACETAMINOPHEN 325 MG PO TABS
650.0000 mg | ORAL_TABLET | ORAL | Status: DC | PRN
  Administered 2023-03-10 – 2023-03-12 (×4): 650 mg via ORAL
  Filled 2023-03-08 (×4): qty 2

## 2023-03-08 MED ORDER — ATORVASTATIN CALCIUM 40 MG PO TABS
40.0000 mg | ORAL_TABLET | Freq: Every day | ORAL | Status: DC
  Administered 2023-03-08 – 2023-03-12 (×5): 40 mg via ORAL
  Filled 2023-03-08 (×5): qty 1

## 2023-03-08 NOTE — ED Triage Notes (Signed)
Patient arrives ambulatory by POV c/o left sided chest pain onset of yesterday. Also having bilateral leg swelling for a couple weeks. Feels like something sitting on her chest. Took 1 nitro tablet 4:30 am today. Reports feeling her heart beat in her ears.

## 2023-03-08 NOTE — Progress Notes (Signed)
ANTICOAGULATION CONSULT NOTE - Initial Consult  Pharmacy Consult for heparin Indication: atrial fibrillation  Allergies  Allergen Reactions   Flagyl [Metronidazole] Diarrhea and Nausea And Vomiting    Patient Measurements: Height: 5' (152.4 cm) Weight: 63.5 kg (140 lb) IBW/kg (Calculated) : 45.5 Heparin Dosing Weight: 59kg  Vital Signs: Temp: 98.4 F (36.9 C) (04/21 1446) Temp Source: Oral (04/21 1444) BP: 172/147 (04/21 1446) Pulse Rate: 85 (04/21 1446)  Labs: No results for input(s): "HGB", "HCT", "PLT", "APTT", "LABPROT", "INR", "HEPARINUNFRC", "HEPRLOWMOCWT", "CREATININE", "CKTOTAL", "CKMB", "TROPONINIHS" in the last 72 hours.  CrCl cannot be calculated (Patient's most recent lab result is older than the maximum 21 days allowed.).   Medical History: Past Medical History:  Diagnosis Date   Alcohol abuse    Arthritis    Elevated LFTs    Gout    Hyperlipidemia    Multifocal atrial tachycardia    Non-obstructive CAD    a. Coronary CTA 02/2022:Coronary calcium score of 1,211 (99th percentile for age and sex) with diffuse calcified and noncalcified plaque (25-49%), distal small branch of OM disease (75-99%) and a vessel that of peers too small for PCI, and mild calcified plaque of the ostial left main with no stenosis. FFR was sent and showed no flow-limiting CAD.  Therefore, medical therapy was recommended.   Paroxysmal SVT (supraventricular tachycardia)    Tobacco abuse     Assessment: 4 YOF presenting with CP, in afib, she is not on anticoagulation PTA  Goal of Therapy:  Heparin level 0.3-0.7 units/ml Monitor platelets by anticoagulation protocol: Yes   Plan:  Heparin 3000 units IV x 1, and gtt at 850 units/hr F/u 6 hour heparin level F/u long term [facility] plan  Daylene Posey, PharmD, [facility] Clinical Pharmacist ED Pharmacist Phone # [phone] 03/08/2023 3:23 PM

## 2023-03-08 NOTE — ED Provider Notes (Signed)
Emergency Department Provider Note   I have reviewed the triage vital signs and the nursing notes.   HISTORY  Chief Complaint Chest Pain   HPI Jennifer Pennington is a 67 y.o. female with PMH of MAT, SVT, CAD (non-obstructive) presents to the emergency department for evaluation of chest pain and heart palpitations along with shortness of breath.  She has had intermittent symptoms over the past several months.  She tells me she had an admission in 2023 around this time with similar symptoms.  She was not prescribed anticoagulation and is not taking any medications for rate or rhythm control.  She was discharged home with some nitroglycerin which she tried this morning with chest pain significantly worsened along with palpitations.  This gave her increased pain in her neck rather than reducing symptoms.  She went to urgent care but was redirected to the emergency department.  No fevers or chills.  No unilateral weakness or numbness.  No syncope.  Past Medical History:  Diagnosis Date   Alcohol abuse    Arthritis    Elevated LFTs    Gout    Hyperlipidemia    Multifocal atrial tachycardia    Non-obstructive CAD    a. Coronary CTA 02/2022:Coronary calcium score of 1,211 (99th percentile for age and sex) with diffuse calcified and noncalcified plaque (25-49%), distal small branch of OM disease (75-99%) and a vessel that of peers too small for PCI, and mild calcified plaque of the ostial left main with no stenosis. FFR was sent and showed no flow-limiting CAD.  Therefore, medical therapy was recommended.   Paroxysmal SVT (supraventricular tachycardia)    Tobacco abuse     Review of Systems  Constitutional: No fever/chills Eyes: No visual changes. ENT: No sore throat. Cardiovascular: Positive chest pain and palpitations.  Respiratory: Mild shortness of breath. Gastrointestinal: No abdominal pain.  No nausea, no vomiting.  No diarrhea.  No constipation. Musculoskeletal: Negative for back  pain. Skin: Negative for rash. Neurological: Negative for headaches. No weakness/numbness.    ____________________________________________   PHYSICAL EXAM:  VITAL SIGNS: ED Triage Vitals  Enc Vitals Group     BP 03/08/23 1446 (!) 172/147     Pulse Rate 03/08/23 1444 75     Resp 03/08/23 1444 18     Temp 03/08/23 1444 98.4 F (36.9 C)     Temp Source 03/08/23 1444 Oral     SpO2 03/08/23 1444 96 %     Weight 03/08/23 1443 140 lb (63.5 kg)     Height 03/08/23 1443 5' (1.524 m)   Constitutional: Alert and oriented. Able to provide a full history but appears uneasy.  Eyes: Conjunctivae are normal. Head: Atraumatic. Nose: No congestion/rhinnorhea. Mouth/Throat: Mucous membranes are moist.  Neck: No stridor.   Cardiovascular: A fib with RVR. Good peripheral circulation. Grossly normal heart sounds.   Respiratory: Normal respiratory effort.  No retractions. Lungs CTAB. Gastrointestinal: Soft and nontender. No distention.  Musculoskeletal: No lower extremity tenderness with 1_ pitting edema in the bilateral LEs. No gross deformities of extremities. Neurologic:  Normal speech and language. No gross focal neurologic deficits are appreciated.  Skin:  Skin is warm, dry and intact. No rash noted.  ____________________________________________   LABS (all labs ordered are listed, but only abnormal results are displayed)  Labs Reviewed  CBC WITH DIFFERENTIAL/PLATELET - Abnormal; Notable for the following components:      Result Value   WBC 11.2 (*)    RBC 5.26 (*)    Hemoglobin  16.2 (*)    HCT 49.1 (*)    Monocytes Absolute 1.1 (*)    All other components within normal limits  I-STAT CHEM 8, ED - Abnormal; Notable for the following components:   Sodium 132 (*)    Chloride 96 (*)    BUN 6 (*)    Calcium, Ion 1.12 (*)    Hemoglobin 16.0 (*)    HCT 47.0 (*)    All other components within normal limits  TROPONIN I (HIGH SENSITIVITY) - Abnormal; Notable for the following  components:   Troponin I (High Sensitivity) 53 (*)    All other components within normal limits  PROTIME-INR  HEPARIN LEVEL (UNFRACTIONATED)  COMPREHENSIVE METABOLIC PANEL  TSH  MAGNESIUM  HEPARIN LEVEL (UNFRACTIONATED)  CBC  HEPARIN LEVEL (UNFRACTIONATED)   ____________________________________________  EKG   EKG Interpretation  Date/Time:  Sunday March 08 2023 14:45:32 EDT Ventricular Rate:  168 PR Interval:    QRS Duration: 70 QT Interval:  262 QTC Calculation: 437 R Axis:   56 Text Interpretation: Atrial fibrillation with rapid ventricular response Nonspecific T wave abnormality Abnormal ECG When compared with ECG of 12-Mar-2022 06:29, PREVIOUS ECG IS PRESENT Confirmed by Kristine Royal [phone]) on 03/08/2023 2:47:46 PM        ____________________________________________  RADIOLOGY  DG Chest Portable 1 View  Result Date: 03/08/2023 CLINICAL DATA:  Chest pain EXAM: PORTABLE CHEST 1 VIEW COMPARISON:  X-ray 03/10/2022 and CT angiogram FINDINGS: Enlarged cardiopericardial silhouette. Tiny pleural effusion on the right. Likely chronic interstitial changes. No pneumothorax, edema. Overlapping cardiac leads IMPRESSION: Enlarged cardiopericardial silhouette. Tiny right effusion. Chronic lung changes Electronically Signed   By: Karen Kays M.D.   On: 03/08/2023 15:23    ____________________________________________   PROCEDURES  Procedure(s) performed:   Procedures  CRITICAL CARE Performed by: Maia Plan Total critical care time: 35 minutes Critical care time was exclusive of separately billable procedures and treating other patients. Critical care was necessary to treat or prevent imminent or life-threatening deterioration. Critical care was time spent personally by me on the following activities: development of treatment plan with patient and/or surrogate as well as nursing, discussions with consultants, evaluation of patient's response to treatment, examination of  patient, obtaining history from patient or surrogate, ordering and performing treatments and interventions, ordering and review of laboratory studies, ordering and review of radiographic studies, pulse oximetry and re-evaluation of patient's condition.  Alona Bene, MD Emergency Medicine  ____________________________________________   INITIAL IMPRESSION / ASSESSMENT AND PLAN / ED COURSE  Pertinent labs & imaging results that were available during my care of the patient were reviewed by me and considered in my medical decision making (see chart for details).   This patient is Presenting for Evaluation of CP, which does require a range of treatment options, and is a complaint that involves a high risk of morbidity and mortality.  The Differential Diagnoses includes but is not exclusive to acute coronary syndrome, aortic dissection, pulmonary embolism, cardiac tamponade, community-acquired pneumonia, pericarditis, musculoskeletal chest wall pain, etc.   Critical Interventions-    Medications  diltiazem (CARDIZEM) 125 mg in dextrose 5% 125 mL (1 mg/mL) infusion (5 mg/hr Intravenous New Bag/Given 03/08/23 1613)  heparin ADULT infusion 100 units/mL (25000 units/239mL) (850 Units/hr Intravenous New Bag/Given 03/08/23 1610)  diltiazem (CARDIZEM) 1 mg/mL load via infusion 15 mg (15 mg Intravenous Bolus from Bag 03/08/23 1613)  heparin bolus via infusion 3,000 Units (3,000 Units Intravenous Bolus from Bag 03/08/23 1611)    Reassessment after  intervention: HR improved and CP/SOB much improved as well.    I did obtain Additional Historical Information from daughter at bedside.  I decided to review pertinent External Data, and in summary patient was admitted this time last year with initial concern for A-fib but cardiology felt was more consistent with multifocal atrial tachycardia.  Discharged home on Lopressor twice daily which she has since stopped. No anticoagulation as this was diagnosed as MAT.     Clinical Laboratory Tests Ordered mild troponin of 53 likely demand ischemia no anemia.  No acute kidney injury.  Radiologic Tests Ordered, included CXR. I independently interpreted the images and agree with radiology interpretation.   Cardiac Monitor Tracing which shows A fib RVR.    Social Determinants of Health Risk patient smokes and has history of EtOH use.   Consult complete with [facility], Dr. Chipper Herb. Plan for admit.   Medical Decision Making: Summary:  Patient presents emergency department for evaluation of chest pain, shortness of breath, heart palpitations.  Overall seems unwell but not hypotensive or altered requiring immediate cardioversion.  She appears to be in A-fib RVR here but blood pressure actually high.  Last echo shows no CHF.  She is not anticoagulated and has had intermittent symptoms over the last several months.    Reevaluation with update and discussion with patient and family. Feeling much better with reduced HR. HR now in the 100-110 range. No hypotension. Plan for admit. She is in agreement.   Patient's presentation is most consistent with acute presentation with potential threat to life or bodily function.   Disposition: admit  ____________________________________________  FINAL CLINICAL IMPRESSION(S) / ED DIAGNOSES  Final diagnoses:  Atrial fibrillation with RVR  Precordial chest pain    Note:  This document was prepared using Dragon voice recognition software and may include unintentional dictation errors.  Alona Bene, MD, [facility] Emergency Medicine    Addie Alonge, Arlyss Repress, MD 03/08/23 [phone]

## 2023-03-08 NOTE — H&P (Signed)
History and Physical    Jennifer Pennington ZOX:[id number] DOB: Apr 10, 1956 DOA: 03/08/2023  PCP: Patient, No Pcp Per (Confirm with patient/family/NH records and if not entered, this has to be entered at [facility] point of entry) Patient coming from: Home  I have personally briefly reviewed patient's old medical records in [facility]  Chief Complaint: Palpitations, chest pain, shortness of breath, leg swelling  HPI: Jennifer Pennington is a 67 y.o. female with medical history significant of HTN, HLD, nonobstructive CAD, paroxysmal SVTs, presented with worsening of palpitations shortness of breath chest pain and leg swelling.  Symptoms started about 1 month ago with intermittent feeling of palpitations and initiation of exertional dyspnea gradually getting worse.  More frequent palpitation episodes in last week and significant decrease of exercise tolerance, walking less than 1 minutes can cause severe shortness of breath had to stop to catch her breath.  This morning patient developed constant feeling of palpitations and pressure-like chest pain, nonradiating associated with wheezing and worsening shortness of breath.  She also noticed her leg has swelled up for last 2 days, denies any leg pain.  ED Course: Patient was found to be in A-fib with RVR initial heart rate 170s, chest x-ray showed cardiomegaly and pulmonary congestions.  Blood work showed K3.7, WBC 11.2, creatinine 0.6, troponin 57.  EKG showed A-fib no significant ST-T changes.  Patient was started on Cardizem drip in the ED and heart rate dropped to 120s.  Review of Systems: As per HPI otherwise 14 point review of systems negative.    Past Medical History:  Diagnosis Date   Alcohol abuse    Arthritis    Elevated LFTs    Gout    Hyperlipidemia    Multifocal atrial tachycardia    Non-obstructive CAD    a. Coronary CTA 02/2022:Coronary calcium score of 1,211 (99th percentile for age and sex) with diffuse calcified and noncalcified  plaque (25-49%), distal small branch of OM disease (75-99%) and a vessel that of peers too small for PCI, and mild calcified plaque of the ostial left main with no stenosis. FFR was sent and showed no flow-limiting CAD.  Therefore, medical therapy was recommended.   Paroxysmal SVT (supraventricular tachycardia)    Tobacco abuse     Past Surgical History:  Procedure Laterality Date   TUBAL LIGATION       reports that she has been smoking cigarettes. She has been smoking an average of 1 pack per day. She has never used smokeless tobacco. She reports current alcohol use of about 35.0 standard drinks of alcohol per week. She reports that she does not use drugs.  Allergies  Allergen Reactions   Flagyl [Metronidazole] Diarrhea and Nausea And Vomiting    Family History  Problem Relation Age of Onset   Heart disease Father     Prior to Admission medications   Medication Sig Start Date End Date Taking? Authorizing Provider  aspirin 81 MG EC tablet Take 1 tablet (81 mg total) by mouth daily. Swallow whole. 03/12/22   Marjie Skiff E, PA-C  atorvastatin (LIPITOR) 40 MG tablet Take 1 tablet (40 mg total) by mouth daily. Please call to schedule an overdue appointment with cardiology for refills, [phone], thank you. 2ND attempt. 07/07/22   Marjie Skiff E, PA-C  metoprolol tartrate (LOPRESSOR) 25 MG tablet Take 1 tablet (25 mg total) by mouth 2 (two) times daily. Please call to schedule an overdue appointment with cardiology for refills, [phone], thank you. 2ND attempt. 07/07/22   Marjie Skiff  E, PA-C  nitroGLYCERIN (NITROSTAT) 0.4 MG SL tablet Place 1 tablet (0.4 mg total) under the tongue every 5 (five) minutes x 3 doses as needed for chest pain. 03/12/22   Corrin Parker, PA-C    Physical Exam: Vitals:   03/08/23 1715 03/08/23 1730 03/08/23 1824 03/08/23 1827  BP: (!) 132/122 (!) 152/129 (!) 166/119   Pulse: (!) 132 (!) 111 (!) 125   Resp: (!) 27 (!) 28 (!) 26   Temp:     98.7 F (37.1 C)  TempSrc:    Oral  SpO2: 91% 94% 96%   Weight:      Height:        Constitutional: NAD, calm, comfortable Vitals:   03/08/23 1715 03/08/23 1730 03/08/23 1824 03/08/23 1827  BP: (!) 132/122 (!) 152/129 (!) 166/119   Pulse: (!) 132 (!) 111 (!) 125   Resp: (!) 27 (!) 28 (!) 26   Temp:    98.7 F (37.1 C)  TempSrc:    Oral  SpO2: 91% 94% 96%   Weight:      Height:       Eyes: PERRL, lids and conjunctivae normal ENMT: Mucous membranes are moist. Posterior pharynx clear of any exudate or lesions.Normal dentition.  Neck: normal, supple, no masses, no thyromegaly Respiratory: clear to auscultation bilaterally, scattered wheezing, no crackles. Normal respiratory effort. No accessory muscle use.  Cardiovascular: Irregular heart rate tachycardia, no murmurs / rubs / gallops.  2+ extremity edema. 2+ pedal pulses. No carotid bruits.  Abdomen: no tenderness, no masses palpated. No hepatosplenomegaly. Bowel sounds positive.  Musculoskeletal: no clubbing / cyanosis. No joint deformity upper and lower extremities. Good ROM, no contractures. Normal muscle tone.  Skin: no rashes, lesions, ulcers. No induration Neurologic: CN 2-12 grossly intact. Sensation intact, DTR normal. Strength 5/5 in all 4.  Psychiatric: Normal judgment and insight. Alert and oriented x 3. Normal mood.     Labs on Admission: I have personally reviewed following labs and imaging studies  CBC: Recent Labs  Lab 03/08/23 1448 03/08/23 1649  WBC 11.2*  --   NEUTROABS 6.6  --   HGB 16.2* 16.0*  HCT 49.1* 47.0*  MCV 93.3  --   PLT 297  --    Basic Metabolic Panel: Recent Labs  Lab 03/08/23 1649  NA 132*  K 3.7  CL 96*  GLUCOSE 95  BUN 6*  CREATININE 0.60   GFR: Estimated Creatinine Clearance: 59.1 mL/min (by C-G formula based on SCr of 0.6 mg/dL). Liver Function Tests: No results for input(s): "AST", "ALT", "ALKPHOS", "BILITOT", "PROT", "ALBUMIN" in the last 168 hours. No results for  input(s): "LIPASE", "AMYLASE" in the last 168 hours. No results for input(s): "AMMONIA" in the last 168 hours. Coagulation Profile: Recent Labs  Lab 03/08/23 1505  INR 1.1   Cardiac Enzymes: No results for input(s): "CKTOTAL", "CKMB", "CKMBINDEX", "TROPONINI" in the last 168 hours. BNP (last 3 results) No results for input(s): "PROBNP" in the last 8760 hours. HbA1C: No results for input(s): "HGBA1C" in the last 72 hours. CBG: No results for input(s): "GLUCAP" in the last 168 hours. Lipid Profile: No results for input(s): "CHOL", "HDL", "LDLCALC", "TRIG", "CHOLHDL", "LDLDIRECT" in the last 72 hours. Thyroid Function Tests: No results for input(s): "TSH", "T4TOTAL", "FREET4", "T3FREE", "THYROIDAB" in the last 72 hours. Anemia Panel: No results for input(s): "VITAMINB12", "FOLATE", "FERRITIN", "TIBC", "IRON", "RETICCTPCT" in the last 72 hours. Urine analysis: No results found for: "COLORURINE", "APPEARANCEUR", "LABSPEC", "PHURINE", "GLUCOSEU", "HGBUR", "BILIRUBINUR", "KETONESUR", "  PROTEINUR", "UROBILINOGEN", "NITRITE", "LEUKOCYTESUR"  Radiological Exams on Admission: DG Chest Portable 1 View  Result Date: 03/08/2023 CLINICAL DATA:  Chest pain EXAM: PORTABLE CHEST 1 VIEW COMPARISON:  X-ray 03/10/2022 and CT angiogram FINDINGS: Enlarged cardiopericardial silhouette. Tiny pleural effusion on the right. Likely chronic interstitial changes. No pneumothorax, edema. Overlapping cardiac leads IMPRESSION: Enlarged cardiopericardial silhouette. Tiny right effusion. Chronic lung changes Electronically Signed   By: Karen Kays M.D.   On: 03/08/2023 15:23    EKG: Independently reviewed.  A-fib with RVR, no acute ST changes.  Assessment/Plan Principal Problem:   Atrial flutter Active Problems:   A-fib   Cardiac asthma   Chronic diastolic CHF (congestive heart failure)  (please populate well all problems here in Problem List. (For example, if patient is on BP meds at home and you resume or  decide to hold them, it is a problem that needs to be her. Same for CAD, COPD, HLD and so on)  A-fib with RVR -Suspect baseline PAF -Given there is no symptoms signs of new onset of CHF with decompensation, 1 dose of digoxin given -Continue Cardizem drip -Increase her home regimen dose of metoprolol from 25 to 50 mg twice daily -CHADs2=2, indication for long-term anticoagulation, on heparin drip now, expect switch to p.o. Eliquis on discharge.  Acute, probably on chronic HFpEF decompensation -Probably cardiac asthma -Likely secondary to rapid A-fib and uncontrolled hypertension -Treat A-fib as above -Start IV Lasix 40 mg daily -Increase metoprolol -Start lisinopril 5 mg daily -Echocardiogram -UA, TSH pending -Atrovent for breathing treatment  Elevated troponins -Probably demanding ischemia secondary to uncontrolled A-fib.  History of established nonobstructive CAD, with recent coronary artery CT study showed nonobstructive CAD's in 2023. -Trend troponins, echo ordered. -Continue aspirin and statin  HTN, uncontrolled -As above   DVT prophylaxis: Heparin drip Code Status: Full code Family Communication: Daughters at bedside Disposition Plan: Patient is sick with A-fib with RVR along with Consults called: none Admission status: PCU   Emeline General MD [facility] Pager [phone] 03/08/2023, 6:39 PM

## 2023-03-09 ENCOUNTER — Inpatient Hospital Stay (HOSPITAL_COMMUNITY): Payer: BLUE CROSS/BLUE SHIELD

## 2023-03-09 ENCOUNTER — Other Ambulatory Visit (HOSPITAL_COMMUNITY): Payer: Self-pay

## 2023-03-09 ENCOUNTER — Other Ambulatory Visit: Payer: Self-pay

## 2023-03-09 ENCOUNTER — Encounter (HOSPITAL_COMMUNITY): Payer: Self-pay | Admitting: Internal Medicine

## 2023-03-09 DIAGNOSIS — I5033 Acute on chronic diastolic (congestive) heart failure: Secondary | ICD-10-CM | POA: Diagnosis not present

## 2023-03-09 DIAGNOSIS — I4811 Longstanding persistent atrial fibrillation: Secondary | ICD-10-CM | POA: Diagnosis not present

## 2023-03-09 DIAGNOSIS — I4891 Unspecified atrial fibrillation: Secondary | ICD-10-CM | POA: Diagnosis not present

## 2023-03-09 LAB — BASIC METABOLIC PANEL
Anion gap: 14 (ref 5–15)
BUN: 7 mg/dL — ABNORMAL LOW (ref 8–23)
CO2: 26 mmol/L (ref 22–32)
Calcium: 9 mg/dL (ref 8.9–10.3)
Chloride: 93 mmol/L — ABNORMAL LOW (ref 98–111)
Creatinine, Ser: 0.75 mg/dL (ref 0.44–1.00)
GFR, Estimated: >60 mL/min (ref >60)
Glucose, Bld: 93 mg/dL (ref 70–99)
Potassium: 3.7 mmol/L (ref 3.5–5.1)
Sodium: 133 mmol/L — ABNORMAL LOW (ref 135–145)

## 2023-03-09 LAB — ECHOCARDIOGRAM COMPLETE
AR max vel: 2.03 cm2
AV Area VTI: 1.8 cm2
AV Area mean vel: 1.98 cm2
AV Mean grad: 3 mmHg
AV Peak grad: 7.1 mmHg
Ao pk vel: 1.33 m/s
Area-P 1/2: 4.24 cm2
Height: 60 in
S' Lateral: 3.9 cm
Weight: 2240 oz

## 2023-03-09 LAB — CBC
HCT: 44 % (ref 36.0–46.0)
Hemoglobin: 14.5 g/dL (ref 12.0–15.0)
MCH: 30.6 pg (ref 26.0–34.0)
MCHC: 33 g/dL (ref 30.0–36.0)
MCV: 92.8 fL (ref 80.0–100.0)
Platelets: 290 10*3/uL (ref 150–400)
RBC: 4.74 MIL/uL (ref 3.87–5.11)
RDW: 14.5 % (ref 11.5–15.5)
WBC: 9.3 10*3/uL (ref 4.0–10.5)
nRBC: 0 % (ref 0.0–0.2)

## 2023-03-09 LAB — HEPARIN LEVEL (UNFRACTIONATED): Heparin Unfractionated: 0.32 IU/mL (ref 0.30–0.70)

## 2023-03-09 MED ORDER — APIXABAN 5 MG PO TABS
5.0000 mg | ORAL_TABLET | Freq: Two times a day (BID) | ORAL | Status: DC
  Administered 2023-03-09 – 2023-03-12 (×7): 5 mg via ORAL
  Filled 2023-03-09 (×7): qty 1

## 2023-03-09 MED ORDER — POTASSIUM CHLORIDE CRYS ER 20 MEQ PO TBCR
20.0000 meq | EXTENDED_RELEASE_TABLET | Freq: Every day | ORAL | Status: DC
  Administered 2023-03-09 – 2023-03-11 (×3): 20 meq via ORAL
  Filled 2023-03-09 (×3): qty 1

## 2023-03-09 MED ORDER — NICOTINE 14 MG/24HR TD PT24
14.0000 mg | MEDICATED_PATCH | Freq: Every day | TRANSDERMAL | Status: DC
  Administered 2023-03-09 – 2023-03-12 (×4): 14 mg via TRANSDERMAL
  Filled 2023-03-09 (×4): qty 1

## 2023-03-09 NOTE — Progress Notes (Signed)
ANTICOAGULATION CONSULT NOTE   Pharmacy Consult for heparin Indication: atrial fibrillation  Allergies  Allergen Reactions   Flagyl [Metronidazole] Diarrhea and Nausea And Vomiting    Patient Measurements: Height: 5' (152.4 cm) Weight: 63.5 kg (140 lb) IBW/kg (Calculated) : 45.5 Heparin Dosing Weight: 59kg  Vital Signs: Temp: 97.6 F (36.4 C) (04/22 0925) Temp Source: Oral (04/22 0925) BP: 134/75 (04/22 1015) Pulse Rate: 53 (04/22 1015)  Labs: Recent Labs    03/08/23 1437 03/08/23 1448 03/08/23 1448 03/08/23 1505 03/08/23 1649 03/08/23 1940 03/08/23 2250 03/09/23 0151 03/09/23 0929  HGB  --  16.2*   < >  --  16.0*  --   --  14.5  --   HCT  --  49.1*  --   --  47.0*  --   --  44.0  --   PLT  --  297  --   --   --   --   --  290  --   LABPROT  --   --   --  14.0  --   --   --   --   --   INR  --   --   --  1.1  --   --   --   --   --   HEPARINUNFRC 0.70  --   --   --   --   --  0.20*  --  0.32  CREATININE  --   --   --   --  0.60 0.71  --  0.75  --   TROPONINIHS 53*  --   --   --   --  54*  --   --   --    < > = values in this interval not displayed.     Estimated Creatinine Clearance: 59.1 mL/min (by C-G formula based on SCr of 0.75 mg/dL).   Medical History: Past Medical History:  Diagnosis Date   Alcohol abuse    Arthritis    Elevated LFTs    Gout    Hyperlipidemia    Multifocal atrial tachycardia    Non-obstructive CAD    a. Coronary CTA 02/2022:Coronary calcium score of 1,211 (99th percentile for age and sex) with diffuse calcified and noncalcified plaque (25-49%), distal small branch of OM disease (75-99%) and a vessel that of peers too small for PCI, and mild calcified plaque of the ostial left main with no stenosis. FFR was sent and showed no flow-limiting CAD.  Therefore, medical therapy was recommended.   Paroxysmal SVT (supraventricular tachycardia)    Tobacco abuse     Assessment: 75 YOF presenting with CP, in afib, she is not on  anticoagulation PTA.  Pharmacy consulted to dose heparin.  Heparin level 0.32 units/mL (therapeutic) on heparin 1000 units/hr. CBC stable with no signs of bleeding.  Goal of Therapy:  Heparin level 0.3-0.7 units/ml Monitor platelets by anticoagulation protocol: Yes   Plan:  Increase heparin slightly to 1050 units/hr to ensure she remains therapeutic Check heparin level 1800 Daily heparin level and CBC  Eldridge Scot, PharmD Clinical Pharmacist  03/09/2023 10:38 AM   [facility] pharmacy phone numbers are listed on [email or web address]

## 2023-03-09 NOTE — Progress Notes (Signed)
ANTICOAGULATION CONSULT NOTE   Pharmacy Consult for heparin Indication: atrial fibrillation  Allergies  Allergen Reactions   Flagyl [Metronidazole] Diarrhea and Nausea And Vomiting    Patient Measurements: Height: 5' (152.4 cm) Weight: 63.5 kg (140 lb) IBW/kg (Calculated) : 45.5 Heparin Dosing Weight: 59kg  Vital Signs: Temp: 97.2 F (36.2 C) (04/21 2243) Temp Source: Temporal (04/21 2243) BP: 105/67 (04/21 2300) Pulse Rate: 104 (04/21 2300)  Labs: Recent Labs    03/08/23 1437 03/08/23 1448 03/08/23 1505 03/08/23 1649 03/08/23 1940 03/08/23 2250  HGB  --  16.2*  --  16.0*  --   --   HCT  --  49.1*  --  47.0*  --   --   PLT  --  297  --   --   --   --   LABPROT  --   --  14.0  --   --   --   INR  --   --  1.1  --   --   --   HEPARINUNFRC 0.70  --   --   --   --  0.20*  CREATININE  --   --   --  0.60 0.71  --   TROPONINIHS 53*  --   --   --  54*  --     Estimated Creatinine Clearance: 59.1 mL/min (by C-G formula based on SCr of 0.71 mg/dL).   Medical History: Past Medical History:  Diagnosis Date   Alcohol abuse    Arthritis    Elevated LFTs    Gout    Hyperlipidemia    Multifocal atrial tachycardia    Non-obstructive CAD    a. Coronary CTA 02/2022:Coronary calcium score of 1,211 (99th percentile for age and sex) with diffuse calcified and noncalcified plaque (25-49%), distal small branch of OM disease (75-99%) and a vessel that of peers too small for PCI, and mild calcified plaque of the ostial left main with no stenosis. FFR was sent and showed no flow-limiting CAD.  Therefore, medical therapy was recommended.   Paroxysmal SVT (supraventricular tachycardia)    Tobacco abuse     Assessment: 34 YOF presenting with CP, in afib, she is not on anticoagulation PTA.  On IV heparin at 850 units/hr.  No known issues with IV heparin infusion, no overt bleeding or complications noted.  Goal of Therapy:  Heparin level 0.3-0.7 units/ml Monitor platelets by  anticoagulation protocol: Yes   Plan:  Increase IV heparin to 1000 units/hr Recheck heparin level in 8 hrs Daily heparin level and CBC.  Reece Leader, Colon Flattery, BCCP Clinical Pharmacist  03/09/2023 12:16 AM   [facility] pharmacy phone numbers are listed on [email or web address]

## 2023-03-09 NOTE — Consult Note (Addendum)
Cardiology Consultation   Patient ID: Jennifer Pennington MRN: [id number]; DOB: 1956/09/09  Admit date: 03/08/2023 Date of Consult: 03/09/2023  PCP:  Patient, No Pcp Per   [facility] Providers Cardiologist:  Chrystie Nose, MD    Patient Profile:   Jennifer Pennington is a 67 y.o. female with a hx of gout, tobacco use, ETOH use, nonobstructive CAD, MAT, and hyperlipidemia who is being seen 03/09/2023 for the evaluation of atrial fibrillation at the request of Dr. Jerral Ralph.  History of Present Illness:   Jennifer Pennington is a 67 yo female with PMH noted above.  She was seen 02/2022 with chest pain and palpitations.  During that admission her high-sensitivity troponin was minimally elevated and flat.  D-dimer was elevated so a CT chest was negative for PE but showed left main and three-vessel CAD.  She underwent a coronary CTA which showed elevated coronary calcium score of 1211 which was noted at percentile with diffuse calcified and noncalcified plaque with distal branch of OM being 75 to 99% which appeared to be too small for PCI with mild calcified plaque in the ostial left main with no stenosis.  FFR was negative.  Medical therapy was recommended.  She was noted to have multifocal atrial tachycardia as well as short runs of SVT.  She initially was placed on IV Cardizem but ultimately discharged on metoprolol 25 mg twice daily.  LDL was 102 and started on atorvastatin 40 mg daily.  Echocardiogram showed LVEF of 55 to 60% with lateral hypokinesis and grade 3 diastolic dysfunction with mild MR.  She unfortunately was lost to follow-up.  She presented to the ED on 4/21 with complaints of dyspnea on exertion, lower extremity edema and palpitations for the past several months.  Reports she has been out of her medications for approximately 7 months.  She has been under increased stress with a recent separation.  She continues to smoke but has reduced to 5 cigarettes a day.  She continues to drink  alcohol reporting 5-6 beers daily.  In the ED her labs showed sodium 132, potassium 3.7, creatinine 0.6, high-sensitivity troponin 54, BBC 9.3, hemoglobin 14.5, TSH 2.183.  EKG showed atrial fibrillation with RVR, 168 bpm.  Chest x-ray with enlarged cardiac silhouette, tiny right pleural effusion.  She was started on IV Cardizem/heparin and admitted to internal medicine for further management.  Cardiology asked to evaluate.   Past Medical History:  Diagnosis Date   Alcohol abuse    Arthritis    Elevated LFTs    Gout    Hyperlipidemia    Multifocal atrial tachycardia    Non-obstructive CAD    a. Coronary CTA 02/2022:Coronary calcium score of 1,211 (99th percentile for age and sex) with diffuse calcified and noncalcified plaque (25-49%), distal small branch of OM disease (75-99%) and a vessel that of peers too small for PCI, and mild calcified plaque of the ostial left main with no stenosis. FFR was sent and showed no flow-limiting CAD.  Therefore, medical therapy was recommended.   Paroxysmal SVT (supraventricular tachycardia)    Tobacco abuse     Past Surgical History:  Procedure Laterality Date   TUBAL LIGATION       Home Medications:  Prior to Admission medications   Medication Sig Start Date End Date Taking? Authorizing Provider  acetaminophen (TYLENOL) 325 MG tablet Take 650 mg by mouth every 6 (six) hours as needed for mild pain, moderate pain or headache.   Yes [provider]  aspirin  81 MG EC tablet Take 1 tablet (81 mg total) by mouth daily. Swallow whole. 03/12/22  Yes Marjie Skiff E, PA-C  nitroGLYCERIN (NITROSTAT) 0.4 MG SL tablet Place 1 tablet (0.4 mg total) under the tongue every 5 (five) minutes x 3 doses as needed for chest pain. 03/12/22  Yes Marjie Skiff E, PA-C  atorvastatin (LIPITOR) 40 MG tablet Take 1 tablet (40 mg total) by mouth daily. Please call to schedule an overdue appointment with cardiology for refills, [phone], thank you. 2ND  attempt. Patient not taking: Reported on 03/09/2023 07/07/22   Marjie Skiff E, PA-C  metoprolol tartrate (LOPRESSOR) 25 MG tablet Take 1 tablet (25 mg total) by mouth 2 (two) times daily. Please call to schedule an overdue appointment with cardiology for refills, [phone], thank you. 2ND attempt. Patient not taking: Reported on 03/09/2023 07/07/22   Corrin Parker, PA-C    Inpatient Medications: Scheduled Meds:  aspirin EC  81 mg Oral Daily   atorvastatin  40 mg Oral Daily   furosemide  40 mg Intravenous Daily   ipratropium  0.5 mg Nebulization Q6H   lisinopril  5 mg Oral Daily   metoprolol tartrate  50 mg Oral BID   nicotine  14 mg Transdermal Daily   potassium chloride  20 mEq Oral Daily   sodium chloride flush  3 mL Intravenous Q12H   Continuous Infusions:  sodium chloride     heparin 1,050 Units/hr (03/09/23 1050)   PRN Meds: sodium chloride, acetaminophen, ondansetron (ZOFRAN) IV, sodium chloride flush  Allergies:    Allergies  Allergen Reactions   Flagyl [Metronidazole] Diarrhea and Nausea And Vomiting    Social History:   Social History   Socioeconomic History   Marital status: Married    Spouse name: Not on file   Number of children: Not on file   Years of education: Not on file   Highest education level: Not on file  Occupational History   Not on file  Tobacco Use   Smoking status: Every Day    Packs/day: 1    Types: Cigarettes   Smokeless tobacco: Never  Substance and Sexual Activity   Alcohol use: Yes    Alcohol/week: 35.0 standard drinks of alcohol    Types: 35 Cans of beer per week   Drug use: Never   Sexual activity: Not on file  Other Topics Concern   Not on file  Social History Narrative   Not on file   Social Determinants of Health   Financial Resource Strain: Not on file  Food Insecurity: Not on file  Transportation Needs: Not on file  Physical Activity: Not on file  Stress: Not on file  Social Connections: Not on file   Intimate Partner Violence: Not on file    Family History:    Family History  Problem Relation Age of Onset   Heart disease Father      ROS:  Please see the history of present illness.   All other ROS reviewed and negative.     Physical Exam/Data:   Vitals:   03/09/23 1130 03/09/23 1200 03/09/23 1245 03/09/23 1315  BP: 98/73 107/78 127/74   Pulse: 65 74 78   Resp: (!) 25 (!) 21 (!) 29   Temp:    97.9 F (36.6 C)  TempSrc:      SpO2: 95% 93% 94%   Weight:      Height:        Intake/Output Summary (Last 24 hours) at 03/09/2023 1357 Last  data filed at 03/09/2023 0917 Gross per 24 hour  Intake 141.79 ml  Output 2300 ml  Net -2158.21 ml      03/08/2023    2:43 PM 03/11/2022    3:00 AM 03/10/2022   12:34 PM  Last 3 Weights  Weight (lbs) 140 lb 124 lb 5.4 oz 119 lb 14.9 oz  Weight (kg) 63.504 kg 56.4 kg 54.4 kg     Body mass index is 27.34 kg/m.  General:  Well nourished, well developed, in no acute distress HEENT: normal Neck: no JVD Vascular: No carotid bruits; Distal pulses 2+ bilaterally Cardiac:  normal S1, S2; Irreg Irreg; no murmur  Lungs:  clear to auscultation bilaterally, no wheezing, rhonchi or rales  Abd: soft, nontender, no hepatomegaly  Ext: no edema Musculoskeletal:  No deformities, BUE and BLE strength normal and equal Skin: warm and dry  Neuro:  CNs 2-12 intact, no focal abnormalities noted Psych:  Normal affect   EKG:  The EKG was personally reviewed and demonstrates: Atrial fibrillation with RVR, 168 bpm Telemetry:  Telemetry was personally reviewed and demonstrates: Atrial fibrillation, rates 70-90  Relevant CV Studies:  Echo: 03/11/2022  IMPRESSIONS     1. Left ventricular ejection fraction, by estimation, is 55 to 60%. The  left ventricle has normal function. The left ventricle demonstrates  regional wall motion abnormalities (lateral hypokinesis). There is severe  concentric left ventricular  hypertrophy. Left ventricular diastolic  parameters are consistent with  Grade III diastolic dysfunction (restrictive).   2. Right ventricular systolic function is normal. The right ventricular  size is normal. There is normal pulmonary artery systolic pressure. The  estimated right ventricular systolic pressure is 25.8 mmHg.   3. Left atrial size was moderately dilated.   4. The mitral valve is grossly normal. Mild mitral valve regurgitation.  No evidence of mitral stenosis.   5. The aortic valve is tricuspid. Aortic valve regurgitation is not  visualized. No aortic stenosis is present.   6. The inferior vena cava is normal in size with greater than 50%  respiratory variability, suggesting right atrial pressure of 3 mmHg.   Comparison(s): No prior Echocardiogram.   FINDINGS   Left Ventricle: Left ventricular ejection fraction, by estimation, is 55  to 60%. The left ventricle has normal function. The left ventricle  demonstrates regional wall motion abnormalities. The left ventricular  internal cavity size was normal in size.  There is severe concentric left ventricular hypertrophy. Left ventricular  diastolic parameters are consistent with Grade III diastolic dysfunction  (restrictive).     LV Wall Scoring:  The antero-lateral wall and posterior wall are hypokinetic.   Right Ventricle: The right ventricular size is normal. No increase in  right ventricular wall thickness. Right ventricular systolic function is  normal. There is normal pulmonary artery systolic pressure. The tricuspid  regurgitant velocity is 2.39 m/s, and   with an assumed right atrial pressure of 3 mmHg, the estimated right  ventricular systolic pressure is 25.8 mmHg.   Left Atrium: Left atrial size was moderately dilated.   Right Atrium: Right atrial size was normal in size.   Pericardium: There is no evidence of pericardial effusion.   Mitral Valve: Posterior leaflet restriction. The mitral valve is grossly  normal. Mild mitral valve  regurgitation. No evidence of mitral valve  stenosis.   Tricuspid Valve: The tricuspid valve is normal in structure. Tricuspid  valve regurgitation is mild . No evidence of tricuspid stenosis.   Aortic Valve: The aortic valve  is tricuspid. Aortic valve regurgitation is  not visualized. No aortic stenosis is present. Aortic valve peak gradient  measures 8.4 mmHg.   Pulmonic Valve: The pulmonic valve was normal in structure. Pulmonic valve  regurgitation is not visualized. No evidence of pulmonic stenosis.   Aorta: The aortic root and ascending aorta are structurally normal, with  no evidence of dilitation.   Venous: The inferior vena cava is normal in size with greater than 50%  respiratory variability, suggesting right atrial pressure of 3 mmHg.   IAS/Shunts: No atrial level shunt detected by color flow Doppler.    Laboratory Data:  High Sensitivity Troponin:   Recent Labs  Lab 03/08/23 1437 03/08/23 1940  TROPONINIHS 53* 54*     Chemistry Recent Labs  Lab 03/08/23 1649 03/08/23 1940 03/09/23 0151  NA 132* 135 133*  K 3.7 4.4 3.7  CL 96* 94* 93*  CO2  --  24 26  GLUCOSE 95 87 93  BUN 6* 6* 7*  CREATININE 0.60 0.71 0.75  CALCIUM  --  9.7 9.0  MG  --  1.8  --   GFRNONAA  --  >60 >60  ANIONGAP  --  17* 14    Recent Labs  Lab 03/08/23 1940  PROT 7.5  ALBUMIN 3.6  AST 43*  ALT 33  ALKPHOS 112  BILITOT 0.8   Lipids No results for input(s): "CHOL", "TRIG", "HDL", "LABVLDL", "LDLCALC", "CHOLHDL" in the last 168 hours.  Hematology Recent Labs  Lab 03/08/23 1448 03/08/23 1649 03/09/23 0151  WBC 11.2*  --  9.3  RBC 5.26*  --  4.74  HGB 16.2* 16.0* 14.5  HCT 49.1* 47.0* 44.0  MCV 93.3  --  92.8  MCH 30.8  --  30.6  MCHC 33.0  --  33.0  RDW 14.6  --  14.5  PLT 297  --  290   Thyroid  Recent Labs  Lab 03/08/23 1940  TSH 2.183    BNPNo results for input(s): "BNP", "PROBNP" in the last 168 hours.  DDimer No results for input(s): "DDIMER" in the last  168 hours.   Radiology/Studies:  DG Chest Portable 1 View  Result Date: 03/08/2023 CLINICAL DATA:  Chest pain EXAM: PORTABLE CHEST 1 VIEW COMPARISON:  X-ray 03/10/2022 and CT angiogram FINDINGS: Enlarged cardiopericardial silhouette. Tiny pleural effusion on the right. Likely chronic interstitial changes. No pneumothorax, edema. Overlapping cardiac leads IMPRESSION: Enlarged cardiopericardial silhouette. Tiny right effusion. Chronic lung changes Electronically Signed   By: Karen Kays M.D.   On: 03/08/2023 15:23     Assessment and Plan:   Martrice Apt is a 67 y.o. female with a hx of gout, tobacco use, ETOH use, nonobstructive CAD, MAT, and hyperlipidemia who is being seen 03/09/2023 for the evaluation of atrial fibrillation at the request of Dr. Jerral Ralph.  New onset atrial fibrillation with RVR -- Reports palpitations as well as dyspnea and fatigue for the past several months.  Was noted to be in new onset atrial fibrillation with RVR with a heart rate in the 160s on admission.  She was initially placed on Cardizem drip with heart rates improved.  This was stopped and transition to metoprolol 50 mg twice daily.  Currently heart rate is well-controlled --TSH WNL -- On IV heparin will plan for transition to Eliquis 5 mg twice daily -- Echocardiogram pending -- Plan for outpatient cardioversion in 3 weeks  Acute on Chronic HFpEF -- Echo 02/2022 with preserved LVEF, grade 3 diastolic dysfunction.  BNP 383, chest  x-ray with small effusion. -- Currently on IV Lasix 40 mg daily, net -2.3 L -- As above echo pending -- Continue metoprolol 50 mg twice daily  Hypertension -- Well-controlled -- Continue metoprolol 50 mg twice daily, lisinopril 5 mg daily  Hyperlipidemia -- Continue atorvastatin 40 mg daily   Daughter reported several social stressors along with concerns for financial issues.  Will consult case management  Risk Assessment/Risk Scores:   New York Heart Association (NYHA)  Functional Class NYHA Class II  CHA2DS2-VASc Score = 3  This indicates a 3.2% annual risk of stroke. The patient's score is based upon: CHF History: 1 HTN History: 1 Diabetes History: 0 Stroke History: 0 Vascular Disease History: 0 Age Score: 0 Gender Score: 1      For questions or updates, please contact [facility] Please consult [email or web address] for contact info under    Signed, Laverda Page, NP  03/09/2023 1:57 PM  Patient seen and examined.  Agree with above recommendation.  Ms. Crunkleton is a 67 year old female with a history of nonobstructive CAD, multifocal atrial tachycardia, tobacco use, alcohol use, hyperlipidemia who we are consulted for evaluation of atrial fibrillation at the request Dr Jerral Ralph.  She was admitted 02/2022 with chest pain.  Coronary CTA showed calcium score 1211 (99th percentile), 25 to 49% diffuse RCA disease with negative FFR, 75 to 99% distal OM stenosis (too small for PCI), mild stenosis in ostial left main.  Medical therapy was recommended.  She was discharged on metoprolol 25 mg twice daily, as was noted to have multifocal atrial tachycardia as well as runs of SVT during admission.  Echocardiogram at that time showed EF 55 to 60% with lateral hypokinesis, grade 3 diastolic dysfunction, mild mitral regurgitation.  She was lost to follow-up, reports has been off her medications for months.  Presented to ED yesterday with shortness of breath and swelling in legs.  Initial vital signs notable for BP 172/147, pulse up to 160s, SpO2 96% on room air.  Labs notable for creatinine 0.6, sodium 132, hemoglobin 16, TSH 2.18, troponin 53 > 54.  EKG showed atrial fibrillation with RVR, rate 168, nonspecific T wave flattening.  She was started on heparin drip and diltiazem drip.  Heart rate significantly improved, she has been transitioned to metoprolol 50 mg twice daily, currently in A-fib with rate 70s to 80s.  She was diuresed with IV Lasix, reports improvement in  symptoms.  On exam, patient is alert and oriented, irregular, tachycardic, no murmurs, basilar crackles, trace LE edema, no JVD.  For her atrial fibrillation, rates appear well-controlled on metoprolol.  Would continue.  CHA2DS2-VASc 3 (hypertension, female, CAD).  Will transition from heparin drip to Eliquis.  Check echocardiogram.  Given rates well-controlled, will plan outpatient cardioversion once completes 3 weeks of anticoagulation.  However if systolic function is reduced, would consider inpatient TEE/DCCV.  Volume status appears to be improving, would continue IV Lasix, may be able to transition to p.o. tomorrow  Little Ishikawa, MD

## 2023-03-09 NOTE — Plan of Care (Signed)
  Problem: Education: Goal: Knowledge of General Education information will improve Description Including pain rating scale, medication(s)/side effects and non-pharmacologic comfort measures Outcome: Progressing   Problem: Health Behavior/Discharge Planning: Goal: Ability to manage health-related needs will improve Outcome: Progressing   

## 2023-03-09 NOTE — TOC Benefit Eligibility Note (Signed)
Patient Product/process development scientist completed.    The patient is currently admitted and upon discharge could be taking Eliquis 5 mg.  The current 30 day co-pay is $291.52.   The patient is insured through [employer]   This test claim was processed through [employer]- copay amounts may vary at other pharmacies due to [employer], or as the patient moves through the different stages of their insurance plan.  Roland Earl, CPHT Pharmacy Patient Advocate Specialist [facility] Patient Advocate Team Direct Number: [phone]  Fax: [phone]

## 2023-03-09 NOTE — Progress Notes (Signed)
PROGRESS NOTE    Jennifer Pennington  XBM:[id number] DOB: 09-22-1956 DOA: 03/08/2023 PCP: Patient, No Pcp Per    Brief Narrative:  67 year old with history of hypertension, hyperlipidemia, paroxysmal SVT previously on metoprolol, currently not on any maintenance treatment for at least 6 months presents to the emergency room with ongoing shortness of breath, chest discomfort, palpitations and leg swelling for more than 1 month.  She was trying to follow-up with her primary care physician, appointment was in June only.  Her symptoms were worsening.  So she came to the emergency room. In the emergency room, hemodynamically stable.  EKG with A-fib with RVR with initial heart rate 170.  Troponin 57.  Started on Cardizem drip with controlled heart rate.  Admitted to the hospital.   Assessment & Plan:   A-fib with RVR, new onset.  Previous history of paroxysmal SVT.  Likely chronic A-fib. Currently on Cardizem infusion 5 mg/h, heart rate is controlled.  Start metoprolol 50 mg twice daily, discontinue Cardizem. Will continue heparin for now, she will need long-term anticoagulation.  Continue heparin in case she needs any procedures. Electrolytes are adequate. D-dimer 0.9 TSH 2.1.  Suspected acute diastolic heart failure: Likely tachycardia mediated. Presented with orthopnea, PND, shortness of breath, she has S3 gallop on exam. Continue on Lasix 40 mg IV daily, not on diuretics at home. Lisinopril 5 mg Metoprolol 50 mg twice daily Aspirin and a statin. Given significant findings, new onset A-fib will consult cardiology. Echocardiogram pending today.  Elevated troponins, likely demand ischemia with tachycardia.  History of nonobstructive coronary artery disease, CT coronary 2023 with nonobstructive coronary artery disease.  Echo pending.  Continue aspirin and statin.  Smoker: Counseled to quit.  Nicotine patch.  She is motivated.      DVT prophylaxis: Heparin infusion   Code Status: Full  code Family Communication: None at the bedside Disposition Plan: Status is: Inpatient Remains inpatient appropriate because: A-fib, new onset congestive heart failure     Consultants:  Cardiology  Procedures:  None  Antimicrobials:  None   Subjective: Patient seen and examined in the morning rounds.  Telemetry monitor shows heart rate of 70s with A-fib.  Patient tells me that does not feel much different since yesterday but she has not walked. Patient apparently not taking any of the medications last 5 months.  Objective: Vitals:   03/09/23 0919 03/09/23 0925 03/09/23 1000 03/09/23 1015  BP: 116/81  129/85 134/75  Pulse: 74  68 (!) 53  Resp:   18 (!) 29  Temp:  97.6 F (36.4 C)    TempSrc:  Oral    SpO2:   94% 92%  Weight:      Height:        Intake/Output Summary (Last 24 hours) at 03/09/2023 1124 Last data filed at 03/09/2023 0917 Gross per 24 hour  Intake 141.79 ml  Output 2300 ml  Net -2158.21 ml   Filed Weights   03/08/23 1443  Weight: 63.5 kg    Examination:  General: Looks fairly comfortable on minimum oxygen.  Able to talk in complete sentences. Cardiovascular: S1-S2 normal.  S3 gallop present.  Irregularly irregular. Respiratory: Bilateral poor air entry at bases.  Not in any distress. Gastrointestinal: Soft.  Nontender.  Bowel sound present. Ext: 1+ edema.  No cyanosis or clubbing. Neuro: Alert and oriented.  No focal deficits. Musculoskeletal: No deformities.     Data Reviewed: I have personally reviewed following labs and imaging studies  CBC: Recent Labs  Lab 03/08/23  1448 03/08/23 1649 03/09/23 0151  WBC 11.2*  --  9.3  NEUTROABS 6.6  --   --   HGB 16.2* 16.0* 14.5  HCT 49.1* 47.0* 44.0  MCV 93.3  --  92.8  PLT 297  --  290   Basic Metabolic Panel: Recent Labs  Lab 03/08/23 1649 03/08/23 1940 03/09/23 0151  NA 132* 135 133*  K 3.7 4.4 3.7  CL 96* 94* 93*  CO2  --  24 26  GLUCOSE 95 87 93  BUN 6* 6* 7*  CREATININE 0.60  0.71 0.75  CALCIUM  --  9.7 9.0  MG  --  1.8  --    GFR: Estimated Creatinine Clearance: 59.1 mL/min (by C-G formula based on SCr of 0.75 mg/dL). Liver Function Tests: Recent Labs  Lab 03/08/23 1940  AST 43*  ALT 33  ALKPHOS 112  BILITOT 0.8  PROT 7.5  ALBUMIN 3.6   No results for input(s): "LIPASE", "AMYLASE" in the last 168 hours. No results for input(s): "AMMONIA" in the last 168 hours. Coagulation Profile: Recent Labs  Lab 03/08/23 1505  INR 1.1   Cardiac Enzymes: No results for input(s): "CKTOTAL", "CKMB", "CKMBINDEX", "TROPONINI" in the last 168 hours. BNP (last 3 results) No results for input(s): "PROBNP" in the last 8760 hours. HbA1C: No results for input(s): "HGBA1C" in the last 72 hours. CBG: No results for input(s): "GLUCAP" in the last 168 hours. Lipid Profile: No results for input(s): "CHOL", "HDL", "LDLCALC", "TRIG", "CHOLHDL", "LDLDIRECT" in the last 72 hours. Thyroid Function Tests: Recent Labs    03/08/23 1940  TSH 2.183   Anemia Panel: No results for input(s): "VITAMINB12", "FOLATE", "FERRITIN", "TIBC", "IRON", "RETICCTPCT" in the last 72 hours. Sepsis Labs: No results for input(s): "PROCALCITON", "LATICACIDVEN" in the last 168 hours.  No results found for this or any previous visit (from the past 240 hour(s)).       Radiology Studies: DG Chest Portable 1 View  Result Date: 03/08/2023 CLINICAL DATA:  Chest pain EXAM: PORTABLE CHEST 1 VIEW COMPARISON:  X-ray 03/10/2022 and CT angiogram FINDINGS: Enlarged cardiopericardial silhouette. Tiny pleural effusion on the right. Likely chronic interstitial changes. No pneumothorax, edema. Overlapping cardiac leads IMPRESSION: Enlarged cardiopericardial silhouette. Tiny right effusion. Chronic lung changes Electronically Signed   By: Karen Kays M.D.   On: 03/08/2023 15:23        Scheduled Meds:  aspirin EC  81 mg Oral Daily   atorvastatin  40 mg Oral Daily   furosemide  40 mg Intravenous Daily    ipratropium  0.5 mg Nebulization Q6H   lisinopril  5 mg Oral Daily   metoprolol tartrate  50 mg Oral BID   nicotine  14 mg Transdermal Daily   potassium chloride  20 mEq Oral Daily   sodium chloride flush  3 mL Intravenous Q12H   Continuous Infusions:  sodium chloride     heparin 1,050 Units/hr (03/09/23 1050)     LOS: 1 day    Time spent: 40 minutes    Dorcas Carrow, MD [facility] Pager [phone]

## 2023-03-09 NOTE — ED Notes (Signed)
ED TO INPATIENT HANDOFF REPORT  ED Nurse Name and Phone #:   S Name/Age/Gender Jennifer Pennington 67 y.o. female Room/Bed: 001C/001C  Code Status   Code Status: Full Code  Home/SNF/Other Home Patient oriented to: self, place, time, and situation Is this baseline? Yes   Triage Complete: Triage complete  Chief Complaint Atrial flutter [I48.92]  Triage Note Patient arrives ambulatory by POV c/o left sided chest pain onset of yesterday. Also having bilateral leg swelling for a couple weeks. Feels like something sitting on her chest. Took 1 nitro tablet 4:30 am today. Reports feeling her heart beat in her ears.    Allergies Allergies  Allergen Reactions   Flagyl [Metronidazole] Diarrhea and Nausea And Vomiting    Level of Care/Admitting Diagnosis ED Disposition     ED Disposition  Admit   Condition  --   Comment  Hospital Area: [facility] [[id number]]  Level of Care: Progressive [102]  Admit to Progressive based on following criteria: CARDIOVASCULAR & THORACIC of moderate stability with acute coronary syndrome symptoms/low risk myocardial infarction/hypertensive urgency/arrhythmias/heart failure potentially compromising stability and stable post cardiovascular intervention patients.  May admit patient to [facility] or [facility] if equivalent level of care is available:: No  Covid Evaluation: Symptomatic Person Under Investigation (PUI) or recent exposure (last 10 days) *Testing Required*  Diagnosis: Atrial flutter [427.32.ICD-9-CM]  Admitting Physician: Emeline General [[id number]]  Attending Physician: Emeline General [[id number]]  Certification:: I certify this patient will need inpatient services for at least 2 midnights  Estimated Length of Stay: 2          B Medical/Surgery History Past Medical History:  Diagnosis Date   Alcohol abuse    Arthritis    Elevated LFTs    Gout    Hyperlipidemia    Multifocal atrial tachycardia    Non-obstructive CAD     a. Coronary CTA 02/2022:Coronary calcium score of 1,211 (99th percentile for age and sex) with diffuse calcified and noncalcified plaque (25-49%), distal small branch of OM disease (75-99%) and a vessel that of peers too small for PCI, and mild calcified plaque of the ostial left main with no stenosis. FFR was sent and showed no flow-limiting CAD.  Therefore, medical therapy was recommended.   Paroxysmal SVT (supraventricular tachycardia)    Tobacco abuse    Past Surgical History:  Procedure Laterality Date   TUBAL LIGATION       A IV Location/Drains/Wounds Patient Lines/Drains/Airways Status     Active Line/Drains/Airways     Name Placement date Placement time Site Days   Peripheral IV 03/08/23 20 G Left Antecubital 03/08/23  1604  Antecubital  1   Peripheral IV 03/08/23 20 G Right Antecubital 03/08/23  1711  Antecubital  1            Intake/Output Last 24 hours  Intake/Output Summary (Last 24 hours) at 03/09/2023 1442 Last data filed at 03/09/2023 1914 Gross per 24 hour  Intake 141.79 ml  Output 2300 ml  Net -2158.21 ml    Labs/Imaging Results for orders placed or performed during the hospital encounter of 03/08/23 (from the past 48 hour(s))  Heparin level (unfractionated)     Status: None   Collection Time: 03/08/23  2:37 PM  Result Value Ref Range   Heparin Unfractionated 0.70 0.30 - 0.70 IU/mL    Comment: (NOTE) The clinical reportable range upper limit is being lowered to >1.10 to align with the FDA approved guidance for the current laboratory assay.  If heparin results are below expected values, and patient dosage has  been confirmed, suggest follow up testing of antithrombin III levels. Performed at [facility], [address], [place], [place] [address]   Troponin I (High Sensitivity)     Status: Abnormal   Collection Time: 03/08/23  2:37 PM  Result Value Ref Range   Troponin I (High Sensitivity) 53 (H) <18 ng/L    Comment: (NOTE) Elevated high  sensitivity troponin I (hsTnI) values and significant  changes across serial measurements may suggest ACS but many other  chronic and acute conditions are known to elevate hsTnI results.  Refer to the "Links" section for chest pain algorithms and additional  guidance. Performed at [facility], [address] [place], [place] [address]   CBC with Differential     Status: Abnormal   Collection Time: 03/08/23  2:48 PM  Result Value Ref Range   WBC 11.2 (H) 4.0 - 10.5 K/uL   RBC 5.26 (H) 3.87 - 5.11 MIL/uL   Hemoglobin 16.2 (H) 12.0 - 15.0 g/dL   HCT 09.8 (H) 11.9 - 14.7 %   MCV 93.3 80.0 - 100.0 fL   MCH 30.8 26.0 - 34.0 pg   MCHC 33.0 30.0 - 36.0 g/dL   RDW 82.9 56.2 - 13.0 %   Platelets 297 150 - 400 K/uL   nRBC 0.0 0.0 - 0.2 %   Neutrophils Relative % 58 %   Neutro Abs 6.6 1.7 - 7.7 K/uL   Lymphocytes Relative 30 %   Lymphs Abs 3.3 0.7 - 4.0 K/uL   Monocytes Relative 10 %   Monocytes Absolute 1.1 (H) 0.1 - 1.0 K/uL   Eosinophils Relative 1 %   Eosinophils Absolute 0.1 0.0 - 0.5 K/uL   Basophils Relative 1 %   Basophils Absolute 0.1 0.0 - 0.1 K/uL   Immature Granulocytes 0 %   Abs Immature Granulocytes 0.05 0.00 - 0.07 K/uL    Comment: Performed at [facility], [address] [place], [place] [address]  Protime-INR     Status: None   Collection Time: 03/08/23  3:05 PM  Result Value Ref Range   Prothrombin Time 14.0 11.4 - 15.2 seconds   INR 1.1 0.8 - 1.2    Comment: (NOTE) INR goal varies based on device and disease states. Performed at [facility], [address] [place], [place] [address]   I-stat chem 8, ED     Status: Abnormal   Collection Time: 03/08/23  4:49 PM  Result Value Ref Range   Sodium 132 (L) 135 - 145 mmol/L   Potassium 3.7 3.5 - 5.1 mmol/L   Chloride 96 (L) 98 - 111 mmol/L   BUN 6 (L) 8 - 23 mg/dL   Creatinine, Ser 9.52 0.44 - 1.00 mg/dL   Glucose, Bld 95 70 - 99 mg/dL    Comment: Glucose reference range applies only to  samples taken after fasting for at least 8 hours.   Calcium, Ion 1.12 (L) 1.15 - 1.40 mmol/L   TCO2 27 22 - 32 mmol/L   Hemoglobin 16.0 (H) 12.0 - 15.0 g/dL   HCT 84.1 (H) 32.4 - 40.1 %  Comprehensive metabolic panel     Status: Abnormal   Collection Time: 03/08/23  7:40 PM  Result Value Ref Range   Sodium 135 135 - 145 mmol/L   Potassium 4.4 3.5 - 5.1 mmol/L   Chloride 94 (L) 98 - 111 mmol/L  CO2 24 22 - 32 mmol/L   Glucose, Bld 87 70 - 99 mg/dL    Comment: Glucose reference range applies only to samples taken after fasting for at least 8 hours.   BUN 6 (L) 8 - 23 mg/dL   Creatinine, Ser 7.82 0.44 - 1.00 mg/dL   Calcium 9.7 8.9 - 95.6 mg/dL   Total Protein 7.5 6.5 - 8.1 g/dL   Albumin 3.6 3.5 - 5.0 g/dL   AST 43 (H) 15 - 41 U/L   ALT 33 0 - 44 U/L   Alkaline Phosphatase 112 38 - 126 U/L   Total Bilirubin 0.8 0.3 - 1.2 mg/dL   GFR, Estimated >21 >30 mL/min    Comment: (NOTE) Calculated using the CKD-EPI Creatinine Equation (2021)    Anion gap 17 (H) 5 - 15    Comment: Performed at [facility], [address] [place], [place] [address]  Magnesium     Status: None   Collection Time: 03/08/23  7:40 PM  Result Value Ref Range   Magnesium 1.8 1.7 - 2.4 mg/dL    Comment: Performed at [facility], [address], [place], [place] [address]  TSH     Status: None   Collection Time: 03/08/23  7:40 PM  Result Value Ref Range   TSH 2.183 0.350 - 4.500 uIU/mL    Comment: Performed by a 3rd Generation assay with a functional sensitivity of <=0.01 uIU/mL. Performed at [facility], [address], [place], [place] [address]   Troponin I (High Sensitivity)     Status: Abnormal   Collection Time: 03/08/23  7:40 PM  Result Value Ref Range   Troponin I (High Sensitivity) 54 (H) <18 ng/L    Comment: (NOTE) Elevated high sensitivity troponin I (hsTnI) values and significant  changes across serial measurements may suggest ACS but many other  chronic and acute  conditions are known to elevate hsTnI results.  Refer to the "Links" section for chest pain algorithms and additional  guidance. Performed at [facility], [address], [place], [place] [address]   Urinalysis, Routine w reflex microscopic -Urine, Clean Catch     Status: Abnormal   Collection Time: 03/08/23  8:45 PM  Result Value Ref Range   Color, Urine COLORLESS (A) YELLOW   APPearance CLEAR CLEAR   Specific Gravity, Urine 1.004 (L) 1.005 - 1.030   pH 7.0 5.0 - 8.0   Glucose, UA NEGATIVE NEGATIVE mg/dL   Hgb urine dipstick SMALL (A) NEGATIVE   Bilirubin Urine NEGATIVE NEGATIVE   Ketones, ur NEGATIVE NEGATIVE mg/dL   Protein, ur NEGATIVE NEGATIVE mg/dL   Nitrite NEGATIVE NEGATIVE   Leukocytes,Ua NEGATIVE NEGATIVE   RBC / HPF 0-5 0 - 5 RBC/hpf   WBC, UA 0-5 0 - 5 WBC/hpf   Bacteria, UA NONE SEEN NONE SEEN   Squamous Epithelial / HPF 0-5 0 - 5 /HPF   Hyaline Casts, UA PRESENT     Comment: Performed at [facility], [address], [place], [place] [address]  Heparin level (unfractionated)     Status: Abnormal   Collection Time: 03/08/23 10:50 PM  Result Value Ref Range   Heparin Unfractionated 0.20 (L) 0.30 - 0.70 IU/mL    Comment: (NOTE) The clinical reportable range upper limit is being lowered to >1.10 to align with the FDA approved guidance for the current laboratory assay.  If heparin results are below expected values, and patient dosage has  been confirmed, suggest follow up testing of antithrombin III levels. Performed at [facility], [address], [place], [place] [address]   Basic metabolic panel     Status: Abnormal   Collection Time: 03/09/23  1:51 AM  Result Value Ref Range   Sodium 133 (L) 135 - 145 mmol/L   Potassium 3.7 3.5 - 5.1 mmol/L   Chloride 93 (L) 98 - 111 mmol/L   CO2 26 22 - 32 mmol/L   Glucose, Bld 93 70 - 99 mg/dL    Comment: Glucose reference range applies only to samples taken after fasting for at least 8 hours.   BUN  7 (L) 8 - 23 mg/dL   Creatinine, Ser 9.81 0.44 - 1.00 mg/dL   Calcium 9.0 8.9 - 19.1 mg/dL   GFR, Estimated >47 >82 mL/min    Comment: (NOTE) Calculated using the CKD-EPI Creatinine Equation (2021)    Anion gap 14 5 - 15    Comment: Performed at [facility], [address] [place], [place] [address]  CBC     Status: None   Collection Time: 03/09/23  1:51 AM  Result Value Ref Range   WBC 9.3 4.0 - 10.5 K/uL   RBC 4.74 3.87 - 5.11 MIL/uL   Hemoglobin 14.5 12.0 - 15.0 g/dL   HCT 30.8 65.7 - 84.6 %   MCV 92.8 80.0 - 100.0 fL   MCH 30.6 26.0 - 34.0 pg   MCHC 33.0 30.0 - 36.0 g/dL   RDW 96.2 95.2 - 84.1 %   Platelets 290 150 - 400 K/uL   nRBC 0.0 0.0 - 0.2 %    Comment: Performed at [facility], [address], [place], [place] [address]  Heparin level (unfractionated)     Status: None   Collection Time: 03/09/23  9:29 AM  Result Value Ref Range   Heparin Unfractionated 0.32 0.30 - 0.70 IU/mL    Comment: (NOTE) The clinical reportable range upper limit is being lowered to >1.10 to align with the FDA approved guidance for the current laboratory assay.  If heparin results are below expected values, and patient dosage has  been confirmed, suggest follow up testing of antithrombin III levels. Performed at [facility], [address], [place], [place] [address]    DG Chest Portable 1 View  Result Date: 03/08/2023 CLINICAL DATA:  Chest pain EXAM: PORTABLE CHEST 1 VIEW COMPARISON:  X-ray 03/10/2022 and CT angiogram FINDINGS: Enlarged cardiopericardial silhouette. Tiny pleural effusion on the right. Likely chronic interstitial changes. No pneumothorax, edema. Overlapping cardiac leads IMPRESSION: Enlarged cardiopericardial silhouette. Tiny right effusion. Chronic lung changes Electronically Signed   By: Karen Kays M.D.   On: 03/08/2023 15:23    Pending Labs Unresulted Labs (From admission, onward)     Start     Ordered   03/09/23 0500  Basic metabolic panel   Daily,   R     Comments: As Scheduled for 5 days    03/08/23 1838   03/08/23 1838  T3, free  Once,   R        03/08/23 1838            Vitals/Pain Today's Vitals   03/09/23 1130 03/09/23 1200 03/09/23 1245 03/09/23 1315  BP: 98/73 107/78 127/74   Pulse: 65 74 78   Resp: (!) 25 (!) 21 (!) 29   Temp:    97.9 F (36.6 C)  TempSrc:      SpO2:  95% 93% 94%   Weight:      Height:      PainSc:        Isolation Precautions No active isolations  Medications Medications  ipratropium (ATROVENT) nebulizer solution 0.5 mg (0.5 mg Nebulization Given 03/09/23 1420)  furosemide (LASIX) injection 40 mg (40 mg Intravenous Given 03/09/23 0921)  atorvastatin (LIPITOR) tablet 40 mg (40 mg Oral Given 03/09/23 0919)  sodium chloride flush (NS) 0.9 % injection 3 mL (3 mLs Intravenous Not Given 03/09/23 0924)  sodium chloride flush (NS) 0.9 % injection 3 mL (has no administration in time range)  0.9 %  sodium chloride infusion (has no administration in time range)  acetaminophen (TYLENOL) tablet 650 mg (has no administration in time range)  ondansetron (ZOFRAN) injection 4 mg (has no administration in time range)  metoprolol tartrate (LOPRESSOR) tablet 50 mg (50 mg Oral Given 03/09/23 0919)  lisinopril (ZESTRIL) tablet 5 mg (5 mg Oral Given 03/09/23 0919)  aspirin EC tablet 81 mg (81 mg Oral Given 03/09/23 0918)  potassium chloride SA (KLOR-CON M) CR tablet 20 mEq (20 mEq Oral Given 03/09/23 1134)  nicotine (NICODERM CQ - dosed in mg/24 hours) patch 14 mg (14 mg Transdermal Patch Applied 03/09/23 1134)  apixaban (ELIQUIS) tablet 5 mg (5 mg Oral Given 03/09/23 1420)  diltiazem (CARDIZEM) 1 mg/mL load via infusion 15 mg (15 mg Intravenous Bolus from Bag 03/08/23 1613)  heparin bolus via infusion 3,000 Units (3,000 Units Intravenous Bolus from Bag 03/08/23 1611)  potassium chloride SA (KLOR-CON M) CR tablet 20 mEq (20 mEq Oral Given 03/08/23 1825)  digoxin (LANOXIN) 0.25 MG/ML injection 0.25 mg (0.25 mg  Intravenous Given 03/08/23 1923)    Mobility walks     Focused Assessments Cardiac Assessment Handoff:  Cardiac Rhythm: Atrial fibrillation No results found for: "CKTOTAL", "CKMB", "CKMBINDEX", "TROPONINI" Lab Results  Component Value Date   DDIMER 0.90 (H) 03/10/2022   Does the Patient currently have chest pain? No    R Recommendations: See Admitting Provider Note  Report given to:   Additional Notes:

## 2023-03-10 ENCOUNTER — Other Ambulatory Visit (HOSPITAL_COMMUNITY): Payer: Self-pay

## 2023-03-10 DIAGNOSIS — I5033 Acute on chronic diastolic (congestive) heart failure: Secondary | ICD-10-CM | POA: Diagnosis not present

## 2023-03-10 DIAGNOSIS — I4891 Unspecified atrial fibrillation: Secondary | ICD-10-CM | POA: Diagnosis not present

## 2023-03-10 DIAGNOSIS — I4811 Longstanding persistent atrial fibrillation: Secondary | ICD-10-CM | POA: Diagnosis not present

## 2023-03-10 LAB — BASIC METABOLIC PANEL
Anion gap: 8 (ref 5–15)
BUN: 15 mg/dL (ref 8–23)
CO2: 30 mmol/L (ref 22–32)
Calcium: 8.7 mg/dL — ABNORMAL LOW (ref 8.9–10.3)
Chloride: 95 mmol/L — ABNORMAL LOW (ref 98–111)
Creatinine, Ser: 0.77 mg/dL (ref 0.44–1.00)
GFR, Estimated: >60 mL/min (ref >60)
Glucose, Bld: 136 mg/dL — ABNORMAL HIGH (ref 70–99)
Potassium: 3.5 mmol/L (ref 3.5–5.1)
Sodium: 133 mmol/L — ABNORMAL LOW (ref 135–145)

## 2023-03-10 LAB — T3, FREE: T3, Free: 3 pg/mL (ref 2.0–4.4)

## 2023-03-10 LAB — MAGNESIUM: Magnesium: 1.8 mg/dL (ref 1.7–2.4)

## 2023-03-10 MED ORDER — IPRATROPIUM BROMIDE 0.02 % IN SOLN: 0.5000 mg | Freq: Four times a day (QID) | RESPIRATORY_TRACT | Status: DC | PRN

## 2023-03-10 MED ORDER — LOSARTAN POTASSIUM 25 MG PO TABS
25.0000 mg | ORAL_TABLET | Freq: Every day | ORAL | Status: DC
  Administered 2023-03-11 – 2023-03-12 (×2): 25 mg via ORAL
  Filled 2023-03-10 (×2): qty 1

## 2023-03-10 MED ORDER — SODIUM CHLORIDE 0.9 % IV SOLN: INTRAVENOUS | Status: DC

## 2023-03-10 MED ORDER — MAGNESIUM OXIDE -MG SUPPLEMENT 400 (240 MG) MG PO TABS
400.0000 mg | ORAL_TABLET | Freq: Once | ORAL | Status: AC
  Administered 2023-03-10: 400 mg via ORAL
  Filled 2023-03-10: qty 1

## 2023-03-10 MED ORDER — FLUOXETINE HCL 10 MG PO CAPS
10.0000 mg | ORAL_CAPSULE | Freq: Every day | ORAL | Status: DC
  Administered 2023-03-10 – 2023-03-12 (×3): 10 mg via ORAL
  Filled 2023-03-10 (×4): qty 1

## 2023-03-10 MED ORDER — ENSURE ENLIVE PO LIQD
237.0000 mL | Freq: Two times a day (BID) | ORAL | Status: DC
  Administered 2023-03-10 – 2023-03-12 (×3): 237 mL via ORAL

## 2023-03-10 NOTE — TOC Progression Note (Signed)
Transition of Care [facility]) - Progression Note    Patient Details  Name: Jennifer Pennington MRN: [id number] Date of Birth: October 04, 1956  [facility] of [facility] CM/SW Contact  Leone Haven, RN Phone Number: 03/10/2023, 4:16 PM  Clinical Narrative:    From home, presents with new onset afib with rvr, CHF.  TOC following.        Expected Discharge Plan and Services                                               Social Determinants of Health (SDOH) Interventions SDOH Screenings   Food Insecurity: No Food Insecurity (03/09/2023)  Housing: Low Risk  (03/09/2023)  Transportation Needs: No Transportation Needs (03/09/2023)  Utilities: Not At Risk (03/09/2023)  Alcohol Screen: Low Risk  (03/10/2023)  Tobacco Use: High Risk (03/09/2023)    Readmission Risk Interventions     No data to display

## 2023-03-10 NOTE — Progress Notes (Signed)
 Rounding Note    Patient Name: Jennifer Pennington Date of Encounter: 03/10/2023  [facility] Cardiologist: Kenneth C Hilty, MD   Subjective   Reports dyspnea has improved but remains short of breath.  Incomplete I/os.  Creatinine stable at 0.77  Inpatient Medications    Scheduled Meds:  apixaban  5 mg Oral BID   aspirin EC  81 mg Oral Daily   atorvastatin  40 mg Oral Daily   feeding supplement  237 mL Oral BID BM   FLUoxetine  10 mg Oral Daily   furosemide  40 mg Intravenous Daily   lisinopril  5 mg Oral Daily   metoprolol tartrate  50 mg Oral BID   nicotine  14 mg Transdermal Daily   potassium chloride  20 mEq Oral Daily   sodium chloride flush  3 mL Intravenous Q12H   Continuous Infusions:  sodium chloride     PRN Meds: sodium chloride, acetaminophen, ipratropium, ondansetron (ZOFRAN) IV, sodium chloride flush   Vital Signs    Vitals:   03/10/23 0433 03/10/23 0512 03/10/23 0738 03/10/23 0758  BP: 123/83  130/88   Pulse: (!) 107  89 88  Resp: 20  18 18  Temp: 98.5 F (36.9 C)  97.8 F (36.6 C)   TempSrc: Oral     SpO2: 91%  92%   Weight:  59 kg    Height:        Intake/Output Summary (Last 24 hours) at 03/10/2023 1133 Last data filed at 03/10/2023 0700 Gross per 24 hour  Intake 240 ml  Output 200 ml  Net 40 ml      03/10/2023    5:12 AM 03/09/2023    5:22 PM 03/08/2023    2:43 PM  Last 3 Weights  Weight (lbs) 130 lb 131 lb 2.8 oz 140 lb  Weight (kg) 58.968 kg 59.5 kg 63.504 kg      Telemetry    Atrial fibrillation rate 90s to 110s- Personally Reviewed  ECG    No new ECG- Personally Reviewed  Physical Exam   GEN: No acute distress.   Neck: No JVD Cardiac: Irregular, tachycardic, no murmurs Respiratory: Clear to auscultation bilaterally. GI: Soft, nontender, non-distended  MS: 1+ edema Neuro:  Nonfocal  Psych: Normal affect   Labs    High Sensitivity Troponin:   Recent Labs  Lab 03/08/23 1437 03/08/23 1940  TROPONINIHS  53* 54*     Chemistry Recent Labs  Lab 03/08/23 1940 03/09/23 0151 03/10/23 0035  NA 135 133* 133*  K 4.4 3.7 3.5  CL 94* 93* 95*  CO2 24 26 30  GLUCOSE 87 93 136*  BUN 6* 7* 15  CREATININE 0.71 0.75 0.77  CALCIUM 9.7 9.0 8.7*  MG 1.8  --  1.8  PROT 7.5  --   --   ALBUMIN 3.6  --   --   AST 43*  --   --   ALT 33  --   --   ALKPHOS 112  --   --   BILITOT 0.8  --   --   GFRNONAA >60 >60 >60  ANIONGAP 17* 14 8    Lipids No results for input(s): "CHOL", "TRIG", "HDL", "LABVLDL", "LDLCALC", "CHOLHDL" in the last 168 hours.  Hematology Recent Labs  Lab 03/08/23 1448 03/08/23 1649 03/09/23 0151  WBC 11.2*  --  9.3  RBC 5.26*  --  4.74  HGB 16.2* 16.0* 14.5  HCT 49.1* 47.0* 44.0  MCV 93.3  --    92.8  MCH 30.8  --  30.6  MCHC 33.0  --  33.0  RDW 14.6  --  14.5  PLT 297  --  290   Thyroid  Recent Labs  Lab 03/08/23 1940  TSH 2.183    BNPNo results for input(s): "BNP", "PROBNP" in the last 168 hours.  DDimer No results for input(s): "DDIMER" in the last 168 hours.   Radiology    ECHOCARDIOGRAM COMPLETE  Result Date: 03/09/2023    ECHOCARDIOGRAM REPORT   [name] Date of Exam: 03/09/2023 Medical Rec #:  [id number]       Height:       60.0 in Accession #:    [id number]      Weight:       140.0 lb Date of Birth:  03/21/1956      BSA:          1.604 m Patient Age:    66 years        BP:           108/68 mmHg Patient Gender: F               HR:           93 bpm. Exam Location:  Inpatient Procedure: 2D Echo, Cardiac Doppler and Color Doppler Indications:    atrial fibrillation  History:        Patient has prior history of Echocardiogram examinations, most                 recent 03/11/2022. CAD; Risk Factors:Hypertension, Dyslipidemia                 and Current Smoker.  Sonographer:    Gina Gealow RVT Referring Phys: PING T ZHANG IMPRESSIONS  1. Left ventricular ejection fraction, by estimation, is 40 to 45%. The left ventricle has mildly decreased function.  The left ventricle demonstrates global hypokinesis. There is moderate concentric left ventricular hypertrophy. Left ventricular diastolic parameters are indeterminate.  2. Right ventricular systolic function is mildly reduced. The right ventricular size is normal. There is normal pulmonary artery systolic pressure. The estimated right ventricular systolic pressure is 25.1 mmHg.  3. Left atrial size was moderately dilated.  4. Right atrial size was mildly dilated.  5. The mitral valve is normal in structure. Trivial mitral valve regurgitation. No evidence of mitral stenosis.  6. The aortic valve is tricuspid. There is mild calcification of the aortic valve. Aortic valve regurgitation is not visualized. No aortic stenosis is present.  7. The inferior vena cava is normal in size with greater than 50% respiratory variability, suggesting right atrial pressure of 3 mmHg.  8. The patient was in atrial fibrillation. FINDINGS  Left Ventricle: Left ventricular ejection fraction, by estimation, is 40 to 45%. The left ventricle has mildly decreased function. The left ventricle demonstrates global hypokinesis. The left ventricular internal cavity size was normal in size. There is  moderate concentric left ventricular hypertrophy. Left ventricular diastolic parameters are indeterminate. Right Ventricle: The right ventricular size is normal. No increase in right ventricular wall thickness. Right ventricular systolic function is mildly reduced. There is normal pulmonary artery systolic pressure. The tricuspid regurgitant velocity is 2.35 m/s, and with an assumed right atrial pressure of 3 mmHg, the estimated right ventricular systolic pressure is 25.1 mmHg. Left Atrium: Left atrial size was moderately dilated. Right Atrium: Right atrial size was mildly dilated. Pericardium: There is no evidence of pericardial effusion. Mitral Valve: The   mitral valve is normal in structure. There is mild calcification of the mitral valve leaflet(s).  Mild mitral annular calcification. Trivial mitral valve regurgitation. No evidence of mitral valve stenosis. Tricuspid Valve: The tricuspid valve is normal in structure. Tricuspid valve regurgitation is trivial. Aortic Valve: The aortic valve is tricuspid. There is mild calcification of the aortic valve. Aortic valve regurgitation is not visualized. No aortic stenosis is present. Aortic valve mean gradient measures 3.0 mmHg. Aortic valve peak gradient measures 7.1 mmHg. Aortic valve area, by VTI measures 1.80 cm. Pulmonic Valve: The pulmonic valve was normal in structure. Pulmonic valve regurgitation is trivial. Aorta: The aortic root is normal in size and structure. Venous: The inferior vena cava is normal in size with greater than 50% respiratory variability, suggesting right atrial pressure of 3 mmHg. IAS/Shunts: No atrial level shunt detected by color flow Doppler.  LEFT VENTRICLE PLAX 2D LVIDd:         4.50 cm   Diastology LVIDs:         3.90 cm   LV e' medial:    5.85 cm/s LV PW:         1.50 cm   LV E/e' medial:  18.5 LV IVS:        1.20 cm   LV e' lateral:   6.30 cm/s LVOT diam:     1.90 cm   LV E/e' lateral: 17.1 LV SV:         36 LV SV Index:   23 LVOT Area:     2.84 cm  RIGHT VENTRICLE            IVC RV Basal diam:  3.40 cm    IVC diam: 1.50 cm RV Mid diam:    2.60 cm RV S prime:     8.18 cm/s TAPSE (M-mode): 1.3 cm LEFT ATRIUM             Index        RIGHT ATRIUM           Index LA Vol (A2C):   73.2 ml 45.63 ml/m  RA Area:     20.90 cm LA Vol (A4C):   53.8 ml 33.54 ml/m  RA Volume:   57.40 ml  35.78 ml/m LA Biplane Vol: 65.9 ml 41.08 ml/m  AORTIC VALVE                    PULMONIC VALVE AV Area (Vmax):    2.03 cm     PV Vmax:       0.79 m/s AV Area (Vmean):   1.98 cm     PV Peak grad:  2.5 mmHg AV Area (VTI):     1.80 cm AV Vmax:           133.00 cm/s AV Vmean:          83.400 cm/s AV VTI:            0.202 m AV Peak Grad:      7.1 mmHg AV Mean Grad:      3.0 mmHg LVOT Vmax:         95.00 cm/s  LVOT Vmean:        58.100 cm/s LVOT VTI:          0.128 m LVOT/AV VTI ratio: 0.63  AORTA Ao Root diam: 2.90 cm Ao Arch diam: 2.7 cm MITRAL VALVE                TRICUSPID VALVE   MV Area (PHT): 4.24 cm     TR Peak grad:   22.1 mmHg MV Decel Time: 179 msec     TR Vmax:        235.00 cm/s MV E velocity: 108.00 cm/s MV A velocity: 31.10 cm/s   SHUNTS MV E/A ratio:  3.47         Systemic VTI:  0.13 m                             Systemic Diam: 1.90 cm Dalton McleanMD Electronically signed by Dalton McleanMD Signature Date/Time: 03/09/2023/5:09:19 PM    Final    DG Chest Portable 1 View  Result Date: 03/08/2023 CLINICAL DATA:  Chest pain EXAM: PORTABLE CHEST 1 VIEW COMPARISON:  X-ray 03/10/2022 and CT angiogram FINDINGS: Enlarged cardiopericardial silhouette. Tiny pleural effusion on the right. Likely chronic interstitial changes. No pneumothorax, edema. Overlapping cardiac leads IMPRESSION: Enlarged cardiopericardial silhouette. Tiny right effusion. Chronic lung changes Electronically Signed   By: Ashok  Gupta M.D.   On: 03/08/2023 15:23    Cardiac Studies     Patient Profile     66 y.o. female with a hx of gout, tobacco use, ETOH use, nonobstructive CAD, MAT, and hyperlipidemia who is being seen 03/09/2023 for the evaluation of atrial fibrillation .   Assessment & Plan    New onset atrial fibrillation with RVR: Reports palpitations as well as dyspnea and fatigue for the past several months.  Was noted to be in new onset atrial fibrillation with RVR with a heart rate in the 160s on admission.  She was initially placed on Cardizem drip with heart rates improved.  This was stopped and transition to metoprolol 50 mg twice daily.  Echo shows EF 40-45%, mild RV dysfunction, no significant valve disease -- Eliquis 5 mg twice daily -- Rates mildly elevated today, and considering systolic dysfunction on echo, recommend restoring sinus rhythm.  Plan for TEE/DCCV.  After careful review of history and examination, the  risks and benefits of transesophageal echocardiogram have been explained including risks of esophageal damage, perforation (1:10,000 risk), bleeding, pharyngeal hematoma as well as other potential complications associated with conscious sedation including aspiration, arrhythmia, respiratory failure and death. Alternatives to treatment were discussed, questions were answered. Patient is willing to proceed.     Acute HFrEF: Echo 02/2022 with preserved LVEF, grade 3 diastolic dysfunction.  BNP 383, chest x-ray with small effusion.  Echo this admission shows EF 40 to 45%.  Currently on IV Lasix 40 mg daily, net -2.1 L -- Volume status improving, would continue IV Lasix for today.  Suspect may be able to transition to p.o. Lasix tomorrow -- Continue metoprolol 50 mg twice daily -- Switch lisinopril to losartan with plans to eventually bridge to Entresto   Hypertension -- Continue metoprolol 50 mg twice daily.  Switch from lisinopril 5 mg daily to losartan 25 mg daily   Hyperlipidemia -- Continue atorvastatin 40 mg daily   For questions or updates, please contact  [facility] Please consult [email or web address] for contact info under        Signed, Macauley Mossberg L Freida Nebel, MD  03/10/2023, 11:33 AM    

## 2023-03-10 NOTE — Progress Notes (Signed)
Heart Failure Nurse Navigator Progress Note  PCP: Patient, No Pcp Per PCP-Cardiologist: Hilty Admission Diagnosis: Atrial fibrillation with RVR, Precordial chest pain.  Admitted from: Urgent Care   Presentation:   Jennifer Pennington presented with left sided chest pain, bilateral lower extremity edema x 2 weeks, took 1 nitro at home. 172/147, IV heparin and Cardizem drips started. CXR with enlarged cardiopericardial silhouette, tiny right pleural effusion. Found to be in afib with RVR. Successful cardioversion on 4/24.  Patient educated on the sign and symptoms of heart failure daily weights, when to call his doctor or go to the ED. Diet/ fluid restrictions, taking all medications as prescribed and attending all medical appointments. Patient verbalized his understanding , a HF TOC appointment was scheduled for 03/30/2023 @ 9 am.   ECHO/ LVEF: 40-45%  Clinical Course:  Past Medical History:  Diagnosis Date   Alcohol abuse    Arthritis    Elevated LFTs    Gout    Hyperlipidemia    Multifocal atrial tachycardia    Non-obstructive CAD    a. Coronary CTA 02/2022:Coronary calcium score of 1,211 (99th percentile for age and sex) with diffuse calcified and noncalcified plaque (25-49%), distal small branch of OM disease (75-99%) and a vessel that of peers too small for PCI, and mild calcified plaque of the ostial left main with no stenosis. FFR was sent and showed no flow-limiting CAD.  Therefore, medical therapy was recommended.   Paroxysmal SVT (supraventricular tachycardia)    Tobacco abuse      Social History   Socioeconomic History   Marital status: Married    Spouse name: Not on file   Number of children: Not on file   Years of education: Not on file   Highest education level: Not on file  Occupational History   Not on file  Tobacco Use   Smoking status: Every Day    Packs/day: 1    Types: Cigarettes   Smokeless tobacco: Never  Substance and Sexual Activity   Alcohol use: Yes     Alcohol/week: 35.0 standard drinks of alcohol    Types: 35 Cans of beer per week   Drug use: Never   Sexual activity: Not on file  Other Topics Concern   Not on file  Social History Narrative   Not on file   Social Determinants of Health   Financial Resource Strain: Not on file  Food Insecurity: No Food Insecurity (03/09/2023)   Hunger Vital Sign    Worried About Running Out of Food in the Last Year: Never true    Ran Out of Food in the Last Year: Never true  Transportation Needs: No Transportation Needs (03/09/2023)   PRAPARE - Administrator, Civil Service (Medical): No    Lack of Transportation (Non-Medical): No  Physical Activity: Not on file  Stress: Not on file  Social Connections: Not on file   Education Assessment and Provision:  Detailed education and instructions provided on heart failure disease management including the following:  Signs and symptoms of Heart Failure When to call the physician Importance of daily weights Low sodium diet Fluid restriction Medication management Anticipated future follow-up appointments  Patient education given on each of the above topics.  Patient acknowledges understanding via teach back method and acceptance of all instructions.  Education Materials:  "Living Better With Heart Failure" Booklet, HF zone tool, & Daily Weight Tracker Tool.  Patient has scale at home: yes Patient has pill box at home: NA  High Risk Criteria for Readmission and/or Poor Patient Outcomes: Heart failure hospital admissions (last 6 months): 0  No Show rate: 14% Difficult social situation: No Demonstrates medication adherence: Yes Primary Language: English Literacy level: Reading, writing, and comprehension  Barriers of Care:   Diet/ fluid restrictions/ daily weights Alcohol cessation ( drinks 5-6 beers per day)    Considerations/Referrals:   Referral made to Heart Failure Pharmacist Stewardship: Yes Referral made to Heart  Failure CSW/NCM TOC: No Referral made to Heart & Vascular TOC clinic: Yes, 03/30/2023 @ 9 am  Items for Follow-up on DC/TOC: Diet/ fluids/ daily weights Alcohol cessation ( drinks 5-6 beers per day) Continued HF education   Rhae Hammock, BSN, RN Heart Failure Print production planner Chat Only

## 2023-03-10 NOTE — H&P (View-Only) (Signed)
Rounding Note    Patient Name: Jennifer Pennington Date of Encounter: 03/10/2023  [facility] Cardiologist: Chrystie Nose, MD   Subjective   Reports dyspnea has improved but remains short of breath.  Incomplete I/os.  Creatinine stable at 0.77  Inpatient Medications    Scheduled Meds:  apixaban  5 mg Oral BID   aspirin EC  81 mg Oral Daily   atorvastatin  40 mg Oral Daily   feeding supplement  237 mL Oral BID BM   FLUoxetine  10 mg Oral Daily   furosemide  40 mg Intravenous Daily   lisinopril  5 mg Oral Daily   metoprolol tartrate  50 mg Oral BID   nicotine  14 mg Transdermal Daily   potassium chloride  20 mEq Oral Daily   sodium chloride flush  3 mL Intravenous Q12H   Continuous Infusions:  sodium chloride     PRN Meds: sodium chloride, acetaminophen, ipratropium, ondansetron (ZOFRAN) IV, sodium chloride flush   Vital Signs    Vitals:   03/10/23 0433 03/10/23 0512 03/10/23 0738 03/10/23 0758  BP: 123/83  130/88   Pulse: (!) 107  89 88  Resp: Temp: 98.5 F (36.9 C)  97.8 F (36.6 C)   TempSrc: Oral     SpO2: 91%  92%   Weight:  59 kg    Height:        Intake/Output Summary (Last 24 hours) at 03/10/2023 1133 Last data filed at 03/10/2023 0700 Gross per 24 hour  Intake 240 ml  Output 200 ml  Net 40 ml      03/10/2023    5:12 AM 03/09/2023    5:22 PM 03/08/2023    2:43 PM  Last 3 Weights  Weight (lbs) 130 lb 131 lb 2.8 oz 140 lb  Weight (kg) 58.968 kg 59.5 kg 63.504 kg      Telemetry    Atrial fibrillation rate 90s to 110s- Personally Reviewed  ECG    No new ECG- Personally Reviewed  Physical Exam   GEN: No acute distress.   Neck: No JVD Cardiac: Irregular, tachycardic, no murmurs Respiratory: Clear to auscultation bilaterally. GI: Soft, nontender, non-distended  MS: 1+ edema Neuro:  Nonfocal  Psych: Normal affect   Labs    High Sensitivity Troponin:   Recent Labs  Lab 03/08/23 1437 03/08/23 1940  TROPONINIHS  53* 54*     Chemistry Recent Labs  Lab 03/08/23 1940 03/09/23 0151 03/10/23 0035  NA 135 133* 133*  K 4.4 3.7 3.5  CL 94* 93* 95*  CO2 GLUCOSE 87 93 136*  BUN 6* 7* 15  CREATININE 0.71 0.75 0.77  CALCIUM 9.7 9.0 8.7*  MG 1.8  --  1.8  PROT 7.5  --   --   ALBUMIN 3.6  --   --   AST 43*  --   --   ALT 33  --   --   ALKPHOS 112  --   --   BILITOT 0.8  --   --   GFRNONAA >60 >60 >60  ANIONGAP 17* 14 8    Lipids No results for input(s): "CHOL", "TRIG", "HDL", "LABVLDL", "LDLCALC", "CHOLHDL" in the last 168 hours.  Hematology Recent Labs  Lab 03/08/23 1448 03/08/23 1649 03/09/23 0151  WBC 11.2*  --  9.3  RBC 5.26*  --  4.74  HGB 16.2* 16.0* 14.5  HCT 49.1* 47.0* 44.0  MCV 93.3  --  92.8  MCH 30.8  --  30.6  MCHC 33.0  --  33.0  RDW 14.6  --  14.5  PLT 297  --  290   Thyroid  Recent Labs  Lab 03/08/23 1940  TSH 2.183    BNPNo results for input(s): "BNP", "PROBNP" in the last 168 hours.  DDimer No results for input(s): "DDIMER" in the last 168 hours.   Radiology    ECHOCARDIOGRAM COMPLETE  Result Date: 03/09/2023    ECHOCARDIOGRAM REPORT   [name] Date of Exam: 03/09/2023 Medical Rec #:  [id number]       Height:       60.0 in Accession #:    [id number]      Weight:       140.0 lb Date of Birth:  12-17-1955      BSA:          1.604 m Patient Age:    66 years        BP:           108/68 mmHg Patient Gender: F               HR:           93 bpm. Exam Location:  Inpatient Procedure: 2D Echo, Cardiac Doppler and Color Doppler Indications:    atrial fibrillation  History:        Patient has prior history of Echocardiogram examinations, most                 recent 03/11/2022. CAD; Risk Factors:Hypertension, Dyslipidemia                 and Current Smoker.  Sonographer:    Dondra Prader RVT Referring Phys: Emeline General IMPRESSIONS  1. Left ventricular ejection fraction, by estimation, is 40 to 45%. The left ventricle has mildly decreased function.  The left ventricle demonstrates global hypokinesis. There is moderate concentric left ventricular hypertrophy. Left ventricular diastolic parameters are indeterminate.  2. Right ventricular systolic function is mildly reduced. The right ventricular size is normal. There is normal pulmonary artery systolic pressure. The estimated right ventricular systolic pressure is 25.1 mmHg.  3. Left atrial size was moderately dilated.  4. Right atrial size was mildly dilated.  5. The mitral valve is normal in structure. Trivial mitral valve regurgitation. No evidence of mitral stenosis.  6. The aortic valve is tricuspid. There is mild calcification of the aortic valve. Aortic valve regurgitation is not visualized. No aortic stenosis is present.  7. The inferior vena cava is normal in size with greater than 50% respiratory variability, suggesting right atrial pressure of 3 mmHg.  8. The patient was in atrial fibrillation. FINDINGS  Left Ventricle: Left ventricular ejection fraction, by estimation, is 40 to 45%. The left ventricle has mildly decreased function. The left ventricle demonstrates global hypokinesis. The left ventricular internal cavity size was normal in size. There is  moderate concentric left ventricular hypertrophy. Left ventricular diastolic parameters are indeterminate. Right Ventricle: The right ventricular size is normal. No increase in right ventricular wall thickness. Right ventricular systolic function is mildly reduced. There is normal pulmonary artery systolic pressure. The tricuspid regurgitant velocity is 2.35 m/s, and with an assumed right atrial pressure of 3 mmHg, the estimated right ventricular systolic pressure is 25.1 mmHg. Left Atrium: Left atrial size was moderately dilated. Right Atrium: Right atrial size was mildly dilated. Pericardium: There is no evidence of pericardial effusion. Mitral Valve: The  mitral valve is normal in structure. There is mild calcification of the mitral valve leaflet(s).  Mild mitral annular calcification. Trivial mitral valve regurgitation. No evidence of mitral valve stenosis. Tricuspid Valve: The tricuspid valve is normal in structure. Tricuspid valve regurgitation is trivial. Aortic Valve: The aortic valve is tricuspid. There is mild calcification of the aortic valve. Aortic valve regurgitation is not visualized. No aortic stenosis is present. Aortic valve mean gradient measures 3.0 mmHg. Aortic valve peak gradient measures 7.1 mmHg. Aortic valve area, by VTI measures 1.80 cm. Pulmonic Valve: The pulmonic valve was normal in structure. Pulmonic valve regurgitation is trivial. Aorta: The aortic root is normal in size and structure. Venous: The inferior vena cava is normal in size with greater than 50% respiratory variability, suggesting right atrial pressure of 3 mmHg. IAS/Shunts: No atrial level shunt detected by color flow Doppler.  LEFT VENTRICLE PLAX 2D LVIDd:         4.50 cm   Diastology LVIDs:         3.90 cm   LV e' medial:    5.85 cm/s LV PW:         1.50 cm   LV E/e' medial:  18.5 LV IVS:        1.20 cm   LV e' lateral:   6.30 cm/s LVOT diam:     1.90 cm   LV E/e' lateral: 17.1 LV SV:         36 LV SV Index:   23 LVOT Area:     2.84 cm  RIGHT VENTRICLE            IVC RV Basal diam:  3.40 cm    IVC diam: 1.50 cm RV Mid diam:    2.60 cm RV S prime:     8.18 cm/s TAPSE (M-mode): 1.3 cm LEFT ATRIUM             Index        RIGHT ATRIUM           Index LA Vol (A2C):   73.2 ml 45.63 ml/m  RA Area:     20.90 cm LA Vol (A4C):   53.8 ml 33.54 ml/m  RA Volume:   57.40 ml  35.78 ml/m LA Biplane Vol: 65.9 ml 41.08 ml/m  AORTIC VALVE                    PULMONIC VALVE AV Area (Vmax):    2.03 cm     PV Vmax:       0.79 m/s AV Area (Vmean):   1.98 cm     PV Peak grad:  2.5 mmHg AV Area (VTI):     1.80 cm AV Vmax:           133.00 cm/s AV Vmean:          83.400 cm/s AV VTI:            0.202 m AV Peak Grad:      7.1 mmHg AV Mean Grad:      3.0 mmHg LVOT Vmax:         95.00 cm/s  LVOT Vmean:        58.100 cm/s LVOT VTI:          0.128 m LVOT/AV VTI ratio: 0.63  AORTA Ao Root diam: 2.90 cm Ao Arch diam: 2.7 cm MITRAL VALVE                TRICUSPID VALVE  MV Area (PHT): 4.24 cm     TR Peak grad:   22.1 mmHg MV Decel Time: 179 msec     TR Vmax:        235.00 cm/s MV E velocity: 108.00 cm/s MV A velocity: 31.10 cm/s   SHUNTS MV E/A ratio:  3.47         Systemic VTI:  0.13 m                             Systemic Diam: 1.90 cm Dalton McleanMD Electronically signed by Wilfred Lacy Signature Date/Time: 03/09/2023/5:09:19 PM    Final    DG Chest Portable 1 View  Result Date: 03/08/2023 CLINICAL DATA:  Chest pain EXAM: PORTABLE CHEST 1 VIEW COMPARISON:  X-ray 03/10/2022 and CT angiogram FINDINGS: Enlarged cardiopericardial silhouette. Tiny pleural effusion on the right. Likely chronic interstitial changes. No pneumothorax, edema. Overlapping cardiac leads IMPRESSION: Enlarged cardiopericardial silhouette. Tiny right effusion. Chronic lung changes Electronically Signed   By: Karen Kays M.D.   On: 03/08/2023 15:23    Cardiac Studies     Patient Profile     67 y.o. female with a hx of gout, tobacco use, ETOH use, nonobstructive CAD, MAT, and hyperlipidemia who is being seen 03/09/2023 for the evaluation of atrial fibrillation .   Assessment & Plan    New onset atrial fibrillation with RVR: Reports palpitations as well as dyspnea and fatigue for the past several months.  Was noted to be in new onset atrial fibrillation with RVR with a heart rate in the 160s on admission.  She was initially placed on Cardizem drip with heart rates improved.  This was stopped and transition to metoprolol 50 mg twice daily.  Echo shows EF 40-45%, mild RV dysfunction, no significant valve disease -- Eliquis 5 mg twice daily -- Rates mildly elevated today, and considering systolic dysfunction on echo, recommend restoring sinus rhythm.  Plan for TEE/DCCV.  After careful review of history and examination, the  risks and benefits of transesophageal echocardiogram have been explained including risks of esophageal damage, perforation (1:10,000 risk), bleeding, pharyngeal hematoma as well as other potential complications associated with conscious sedation including aspiration, arrhythmia, respiratory failure and death. Alternatives to treatment were discussed, questions were answered. Patient is willing to proceed.     Acute HFrEF: Echo 02/2022 with preserved LVEF, grade 3 diastolic dysfunction.  BNP 383, chest x-ray with small effusion.  Echo this admission shows EF 40 to 45%.  Currently on IV Lasix 40 mg daily, net -2.1 L -- Volume status improving, would continue IV Lasix for today.  Suspect may be able to transition to p.o. Lasix tomorrow -- Continue metoprolol 50 mg twice daily -- Switch lisinopril to losartan with plans to eventually bridge to Entresto   Hypertension -- Continue metoprolol 50 mg twice daily.  Switch from lisinopril 5 mg daily to losartan 25 mg daily   Hyperlipidemia -- Continue atorvastatin 40 mg daily   For questions or updates, please contact [facility] Please consult [email or web address] for contact info under        Signed, Little Ishikawa, MD  03/10/2023, 11:33 AM

## 2023-03-10 NOTE — Plan of Care (Signed)
  Problem: Education: Goal: Knowledge of General Education information will improve Description Including pain rating scale, medication(s)/side effects and non-pharmacologic comfort measures Outcome: Progressing   

## 2023-03-10 NOTE — Progress Notes (Signed)
PROGRESS NOTE    Jennifer Pennington  ZOX:[id number] DOB: 02/08/1956 DOA: 03/08/2023 PCP: Patient, No Pcp Per    Brief Narrative:  67 year old with history of hypertension, hyperlipidemia, paroxysmal SVT previously on metoprolol, currently not on any maintenance treatment for at least 6 months presents to the emergency room with ongoing shortness of breath, chest discomfort, palpitations and leg swelling for more than 1 month.  She was trying to follow-up with her primary care physician, appointment was in June only.  Her symptoms were worsening.  So she came to the emergency room. In the emergency room, hemodynamically stable.  EKG with A-fib with RVR with initial heart rate 170.  Troponin 57.  Started on Cardizem drip with controlled heart rate.  Admitted to the hospital.   Assessment & Plan:   A-fib with RVR, new onset.  Previous history of paroxysmal SVT.  Likely chronic A-fib. Initially on Cardizem infusion.  Currently on metoprolol 50 mg twice daily.  Heart rate is acceptable. Heparin converted to Eliquis. Electrolytes are adequate. D-dimer 0.9 TSH 2.1. Due to persistent A-fib, cardiology planning for DCCV.  Acute systolic heart failure: Likely tachycardia mediated. Presented with orthopnea, PND, shortness of breath, she has S3 gallop on exam. Continue on Lasix 40 mg IV daily, not on diuretics at home. Lisinopril 5 mg-switching to losartan with plans for Entresto. Metoprolol 50 mg twice daily Aspirin and a statin. Followed by cardiology.  Elevated troponins, likely demand ischemia with tachycardia.  History of nonobstructive coronary artery disease, CT coronary 2023 with nonobstructive coronary artery disease.  Continue aspirin and statin.  Smoker: Counseled to quit.  Nicotine patch.  She is motivated.  Anxiety/depression: Patient expresses depression symptoms including poor motivation and poor sleep.  Will start low-dose fluoxetine.      DVT prophylaxis: Heparin  infusion apixaban (ELIQUIS) tablet 5 mg   Code Status: Full code Family Communication: None at the bedside Disposition Plan: Status is: Inpatient Remains inpatient appropriate because: A-fib, new onset congestive heart failure     Consultants:  Cardiology  Procedures:  None  Antimicrobials:  None   Subjective:  Patient seen and examined.  No overnight events.  A-fib with heart rate ranging from 90-120 in the telemetry monitor.  Patient herself is walking around.  Denies any chest pain or shortness of breath.  Feels much better. She has been more withdrawn lately.  Her daughter observed her to be depressed.  Want some help.  Denies any suicidal homicidal ideations.  Objective: Vitals:   03/10/23 0433 03/10/23 0512 03/10/23 0738 03/10/23 0758  BP: 123/83  130/88   Pulse: (!) 107  89 88  Resp: 20  18 18   Temp: 98.5 F (36.9 C)  97.8 F (36.6 C)   TempSrc: Oral     SpO2: 91%  92%   Weight:  59 kg    Height:        Intake/Output Summary (Last 24 hours) at 03/10/2023 1235 Last data filed at 03/10/2023 0700 Gross per 24 hour  Intake 240 ml  Output 200 ml  Net 40 ml    Filed Weights   03/08/23 1443 03/09/23 1722 03/10/23 0512  Weight: 63.5 kg 59.5 kg 59 kg    Examination:  General: Looks fairly comfortable on room air.  Walking around. Cardiovascular: S1-S2 normal.  S3 gallop present.  Irregularly irregular. Respiratory: Bilateral poor air entry at bases.  Not in any distress. Gastrointestinal: Soft.  Nontender.  Bowel sound present. Ext: 1+ edema.  No cyanosis or clubbing. Neuro:  Alert and oriented.  No focal deficits. Musculoskeletal: No deformities.     Data Reviewed: I have personally reviewed following labs and imaging studies  CBC: Recent Labs  Lab 03/08/23 1448 03/08/23 1649 03/09/23 0151  WBC 11.2*  --  9.3  NEUTROABS 6.6  --   --   HGB 16.2* 16.0* 14.5  HCT 49.1* 47.0* 44.0  MCV 93.3  --  92.8  PLT 297  --  290    Basic Metabolic  Panel: Recent Labs  Lab 03/08/23 1649 03/08/23 1940 03/09/23 0151 03/10/23 0035  NA 132* 135 133* 133*  K 3.7 4.4 3.7 3.5  CL 96* 94* 93* 95*  CO2  --  GLUCOSE 95 87 93 136*  BUN 6* 6* 7* 15  CREATININE 0.60 0.71 0.75 0.77  CALCIUM  --  9.7 9.0 8.7*  MG  --  1.8  --  1.8    GFR: Estimated Creatinine Clearance: 57.1 mL/min (by C-G formula based on SCr of 0.77 mg/dL). Liver Function Tests: Recent Labs  Lab 03/08/23 1940  AST 43*  ALT 33  ALKPHOS 112  BILITOT 0.8  PROT 7.5  ALBUMIN 3.6    No results for input(s): "LIPASE", "AMYLASE" in the last 168 hours. No results for input(s): "AMMONIA" in the last 168 hours. Coagulation Profile: Recent Labs  Lab 03/08/23 1505  INR 1.1    Cardiac Enzymes: No results for input(s): "CKTOTAL", "CKMB", "CKMBINDEX", "TROPONINI" in the last 168 hours. BNP (last 3 results) No results for input(s): "PROBNP" in the last 8760 hours. HbA1C: No results for input(s): "HGBA1C" in the last 72 hours. CBG: No results for input(s): "GLUCAP" in the last 168 hours. Lipid Profile: No results for input(s): "CHOL", "HDL", "LDLCALC", "TRIG", "CHOLHDL", "LDLDIRECT" in the last 72 hours. Thyroid Function Tests: Recent Labs    03/08/23 1940  TSH 2.183  T3FREE 3.0    Anemia Panel: No results for input(s): "VITAMINB12", "FOLATE", "FERRITIN", "TIBC", "IRON", "RETICCTPCT" in the last 72 hours. Sepsis Labs: No results for input(s): "PROCALCITON", "LATICACIDVEN" in the last 168 hours.  No results found for this or any previous visit (from the past 240 hour(s)).       Radiology Studies: ECHOCARDIOGRAM COMPLETE  Result Date: 03/09/2023    ECHOCARDIOGRAM REPORT   [name] Date of Exam: 03/09/2023 Medical Rec #:  [id number]       Height:       60.0 in Accession #:    [id number]      Weight:       140.0 lb Date of Birth:  05/18/1956      BSA:          1.604 m Patient Age:    66 years        BP:           108/68 mmHg  Patient Gender: F               HR:           93 bpm. Exam Location:  Inpatient Procedure: 2D Echo, Cardiac Doppler and Color Doppler Indications:    atrial fibrillation  History:        Patient has prior history of Echocardiogram examinations, most                 recent 03/11/2022. CAD; Risk Factors:Hypertension, Dyslipidemia                 and Current Smoker.  Sonographer:    Dondra Prader RVT Referring Phys: Emeline General IMPRESSIONS  1. Left ventricular ejection fraction, by estimation, is 40 to 45%. The left ventricle has mildly decreased function. The left ventricle demonstrates global hypokinesis. There is moderate concentric left ventricular hypertrophy. Left ventricular diastolic parameters are indeterminate.  2. Right ventricular systolic function is mildly reduced. The right ventricular size is normal. There is normal pulmonary artery systolic pressure. The estimated right ventricular systolic pressure is 25.1 mmHg.  3. Left atrial size was moderately dilated.  4. Right atrial size was mildly dilated.  5. The mitral valve is normal in structure. Trivial mitral valve regurgitation. No evidence of mitral stenosis.  6. The aortic valve is tricuspid. There is mild calcification of the aortic valve. Aortic valve regurgitation is not visualized. No aortic stenosis is present.  7. The inferior vena cava is normal in size with greater than 50% respiratory variability, suggesting right atrial pressure of 3 mmHg.  8. The patient was in atrial fibrillation. FINDINGS  Left Ventricle: Left ventricular ejection fraction, by estimation, is 40 to 45%. The left ventricle has mildly decreased function. The left ventricle demonstrates global hypokinesis. The left ventricular internal cavity size was normal in size. There is  moderate concentric left ventricular hypertrophy. Left ventricular diastolic parameters are indeterminate. Right Ventricle: The right ventricular size is normal. No increase in right ventricular wall  thickness. Right ventricular systolic function is mildly reduced. There is normal pulmonary artery systolic pressure. The tricuspid regurgitant velocity is 2.35 m/s, and with an assumed right atrial pressure of 3 mmHg, the estimated right ventricular systolic pressure is 25.1 mmHg. Left Atrium: Left atrial size was moderately dilated. Right Atrium: Right atrial size was mildly dilated. Pericardium: There is no evidence of pericardial effusion. Mitral Valve: The mitral valve is normal in structure. There is mild calcification of the mitral valve leaflet(s). Mild mitral annular calcification. Trivial mitral valve regurgitation. No evidence of mitral valve stenosis. Tricuspid Valve: The tricuspid valve is normal in structure. Tricuspid valve regurgitation is trivial. Aortic Valve: The aortic valve is tricuspid. There is mild calcification of the aortic valve. Aortic valve regurgitation is not visualized. No aortic stenosis is present. Aortic valve mean gradient measures 3.0 mmHg. Aortic valve peak gradient measures 7.1 mmHg. Aortic valve area, by VTI measures 1.80 cm. Pulmonic Valve: The pulmonic valve was normal in structure. Pulmonic valve regurgitation is trivial. Aorta: The aortic root is normal in size and structure. Venous: The inferior vena cava is normal in size with greater than 50% respiratory variability, suggesting right atrial pressure of 3 mmHg. IAS/Shunts: No atrial level shunt detected by color flow Doppler.  LEFT VENTRICLE PLAX 2D LVIDd:         4.50 cm   Diastology LVIDs:         3.90 cm   LV e' medial:    5.85 cm/s LV PW:         1.50 cm   LV E/e' medial:  18.5 LV IVS:        1.20 cm   LV e' lateral:   6.30 cm/s LVOT diam:     1.90 cm   LV E/e' lateral: 17.1 LV SV:         36 LV SV Index:   23 LVOT Area:     2.84 cm  RIGHT VENTRICLE            IVC RV Basal diam:  3.40 cm    IVC diam: 1.50 cm  RV Mid diam:    2.60 cm RV S prime:     8.18 cm/s TAPSE (M-mode): 1.3 cm LEFT ATRIUM             Index         RIGHT ATRIUM           Index LA Vol (A2C):   73.2 ml 45.63 ml/m  RA Area:     20.90 cm LA Vol (A4C):   53.8 ml 33.54 ml/m  RA Volume:   57.40 ml  35.78 ml/m LA Biplane Vol: 65.9 ml 41.08 ml/m  AORTIC VALVE                    PULMONIC VALVE AV Area (Vmax):    2.03 cm     PV Vmax:       0.79 m/s AV Area (Vmean):   1.98 cm     PV Peak grad:  2.5 mmHg AV Area (VTI):     1.80 cm AV Vmax:           133.00 cm/s AV Vmean:          83.400 cm/s AV VTI:            0.202 m AV Peak Grad:      7.1 mmHg AV Mean Grad:      3.0 mmHg LVOT Vmax:         95.00 cm/s LVOT Vmean:        58.100 cm/s LVOT VTI:          0.128 m LVOT/AV VTI ratio: 0.63  AORTA Ao Root diam: 2.90 cm Ao Arch diam: 2.7 cm MITRAL VALVE                TRICUSPID VALVE MV Area (PHT): 4.24 cm     TR Peak grad:   22.1 mmHg MV Decel Time: 179 msec     TR Vmax:        235.00 cm/s MV E velocity: 108.00 cm/s MV A velocity: 31.10 cm/s   SHUNTS MV E/A ratio:  3.47         Systemic VTI:  0.13 m                             Systemic Diam: 1.90 cm Dalton McleanMD Electronically signed by Wilfred Lacy Signature Date/Time: 03/09/2023/5:09:19 PM    Final    DG Chest Portable 1 View  Result Date: 03/08/2023 CLINICAL DATA:  Chest pain EXAM: PORTABLE CHEST 1 VIEW COMPARISON:  X-ray 03/10/2022 and CT angiogram FINDINGS: Enlarged cardiopericardial silhouette. Tiny pleural effusion on the right. Likely chronic interstitial changes. No pneumothorax, edema. Overlapping cardiac leads IMPRESSION: Enlarged cardiopericardial silhouette. Tiny right effusion. Chronic lung changes Electronically Signed   By: Karen Kays M.D.   On: 03/08/2023 15:23        Scheduled Meds:  apixaban  5 mg Oral BID   aspirin EC  81 mg Oral Daily   atorvastatin  40 mg Oral Daily   feeding supplement  237 mL Oral BID BM   FLUoxetine  10 mg Oral Daily   furosemide  40 mg Intravenous Daily   [START ON 03/11/2023] losartan  25 mg Oral Daily   magnesium oxide  400 mg Oral Once   metoprolol  tartrate  50 mg Oral BID   nicotine  14 mg Transdermal Daily   potassium chloride  20 mEq Oral Daily  sodium chloride flush  3 mL Intravenous Q12H   Continuous Infusions:  sodium chloride       LOS: 2 days    Time spent: 40 minutes    Dorcas Carrow, MD [facility] Pager [phone]

## 2023-03-10 NOTE — Progress Notes (Signed)
Mobility Specialist Progress Note:   03/10/23 1200  Mobility  Activity Ambulated independently in hallway  Level of Assistance Independent  Assistive Device None  Distance Ambulated (ft) 500 ft  Activity Response Tolerated well  Mobility Referral Yes  $Mobility charge 1 Mobility   Pt agreeable to mobility session. Required no physical assistance throughout. HR up to 120bpm during exercise. Pt denies SOB/palpitations. Back sitting EOB with all needs met.   Addison Lank Mobility Specialist Please contact via SecureChat or  Rehab office at [phone]

## 2023-03-10 NOTE — Discharge Instructions (Signed)
Information on my medicine - ELIQUIS (apixaban)  This medication education was reviewed with me or my healthcare representative as part of my discharge preparation.     Why was Eliquis prescribed for you? Eliquis was prescribed for you to reduce the risk of a blood clot forming that can cause a stroke if you have a medical condition called atrial fibrillation (a type of irregular heartbeat).  What do You need to know about Eliquis ? Take your Eliquis TWICE DAILY - one tablet in the morning and one tablet in the evening with or without food. If you have difficulty swallowing the tablet whole please discuss with your pharmacist how to take the medication safely.  Take Eliquis exactly as prescribed by your doctor and DO NOT stop taking Eliquis without talking to the doctor who prescribed the medication.  Stopping may increase your risk of developing a stroke.  Refill your prescription before you run out.  After discharge, you should have regular check-up appointments with your healthcare provider that is prescribing your Eliquis.  In the future your dose may need to be changed if your kidney function or weight changes by a significant amount or as you get older.  What do you do if you miss a dose? If you miss a dose, take it as soon as you remember on the same day and resume taking twice daily.  Do not take more than one dose of ELIQUIS at the same time to make up a missed dose.  Important Safety Information A possible side effect of Eliquis is bleeding. You should call your healthcare provider right away if you experience any of the following: Bleeding from an injury or your nose that does not stop. Unusual colored urine (red or dark brown) or unusual colored stools (red or black). Unusual bruising for unknown reasons. A serious fall or if you hit your head (even if there is no bleeding).  Some medicines may interact with Eliquis and might increase your risk of bleeding or clotting  while on Eliquis. To help avoid this, consult your healthcare provider or pharmacist prior to using any new prescription or non-prescription medications, including herbals, vitamins, non-steroidal anti-inflammatory drugs (NSAIDs) and supplements.  This website has more information on Eliquis (apixaban): http://www.eliquis.com/eliquis/home =======================================  Atrial Fibrillation    Atrial fibrillation is a type of heartbeat that is irregular or fast. If you have this condition, your heart beats without any order. This makes it hard for your heart to pump blood in a normal way. Atrial fibrillation may come and go, or it may become a long-lasting problem. If this condition is not treated, it can put you at higher risk for stroke, heart failure, and other heart problems.  What are the causes? This condition may be caused by diseases that damage the heart. They include: High blood pressure. Heart failure. Heart valve disease. Heart surgery. Other causes include: Diabetes. Thyroid disease. Being overweight. Kidney disease. Sometimes the cause is not known.  What increases the risk? You are more likely to develop this condition if: You are older. You smoke. You exercise often and very hard. You have a family history of this condition. You are a man. You use drugs. You drink a lot of alcohol. You have lung conditions, such as emphysema, pneumonia, or COPD. You have sleep apnea.  What are the signs or symptoms? Common symptoms of this condition include: A feeling that your heart is beating very fast. Chest pain or discomfort. Feeling short of breath. Suddenly feeling   light-headed or weak. Getting tired easily during activity. Fainting. Sweating. In some cases, there are no symptoms.  How is this treated? Treatment for this condition depends on underlying conditions and how you feel when you have atrial fibrillation. They include: Medicines to: Prevent  blood clots. Treat heart rate or heart rhythm problems. Using devices, such as a pacemaker, to correct heart rhythm problems. Doing surgery to remove the part of the heart that sends bad signals. Closing an area where clots can form in the heart (left atrial appendage). In some cases, your doctor will treat other underlying conditions.  Follow these instructions at home:  Medicines Take over-the-counter and prescription medicines only as told by your doctor. Do not take any new medicines without first talking to your doctor. If you are taking blood thinners: Talk with your doctor before you take any medicines that have aspirin or NSAIDs, such as ibuprofen, in them. Take your medicine exactly as told by your doctor. Take it at the same time each day. Avoid activities that could hurt or bruise you. Follow instructions about how to prevent falls. Wear a bracelet that says you are taking blood thinners. Or, carry a card that lists what medicines you take. Lifestyle         Do not use any products that have nicotine or tobacco in them. These include cigarettes, e-cigarettes, and chewing tobacco. If you need help quitting, ask your doctor. Eat heart-healthy foods. Talk with your doctor about the right eating plan for you. Exercise regularly as told by your doctor. Do not drink alcohol. Lose weight if you are overweight. Do not use drugs, including cannabis.  General instructions If you have a condition that causes breathing to stop for a short period of time (apnea), treat it as told by your doctor. Keep a healthy weight. Do not use diet pills unless your doctor says they are safe for you. Diet pills may make heart problems worse. Keep all follow-up visits as told by your doctor. This is important.  Contact a doctor if: You notice a change in the speed, rhythm, or strength of your heartbeat. You are taking a blood-thinning medicine and you get more bruising. You get tired more easily  when you move or exercise. You have a sudden change in weight.  Get help right away if:    You have pain in your chest or your belly (abdomen). You have trouble breathing. You have side effects of blood thinners, such as blood in your vomit, poop (stool), or pee (urine), or bleeding that cannot stop. You have any signs of a stroke. "BE FAST" is an easy way to remember the main warning signs: B - Balance. Signs are dizziness, sudden trouble walking, or loss of balance. E - Eyes. Signs are trouble seeing or a change in how you see. F - Face. Signs are sudden weakness or loss of feeling in the face, or the face or eyelid drooping on one side. A - Arms. Signs are weakness or loss of feeling in an arm. This happens suddenly and usually on one side of the body. S - Speech. Signs are sudden trouble speaking, slurred speech, or trouble understanding what people say. T - Time. Time to call emergency services. Write down what time symptoms started. You have other signs of a stroke, such as: A sudden, very bad headache with no known cause. Feeling like you may vomit (nausea). Vomiting. A seizure.  These symptoms may be an emergency. Do not wait to   see if the symptoms will go away. Get medical help right away. Call your local emergency services (911 in the [place] Do not drive yourself to the hospital. Summary Atrial fibrillation is a type of heartbeat that is irregular or fast. You are at higher risk of this condition if you smoke, are older, have diabetes, or are overweight. Follow your doctor's instructions about medicines, diet, exercise, and follow-up visits. Get help right away if you have signs or symptoms of a stroke. Get help right away if you cannot catch your breath, or you have chest pain or discomfort. This information is not intended to replace advice given to you by your health care provider. Make sure you discuss any questions you have with your health care provider. Document  Revised: 04/27/2019 Document Reviewed: 04/27/2019 Elsevier Patient Education  2020 [employer] Inc[employer]    

## 2023-03-10 NOTE — Progress Notes (Addendum)
   Heart Failure Stewardship Pharmacist Progress Note   PCP: Patient, No Pcp Per PCP-Cardiologist: Chrystie Nose, MD    HPI:  67 yo F with PMH of MAT, SVT, and CAD.   Presented to the ED on 4/21 with chest pain, LE edema, and palpitations. CXR with enlarged cardiopericardial silhouette, tiny right pleural effusion. Found to be in afib RVR with rates 170s and started on cardizem drip and later transitioned to metoprolol when rates improved. ECHO showed LVEF 40-45%, global hypokinesis, moderate concentric LVH, and RV mildly reduced. Plan for inpatient vs outpatient cardioversion.  Current HF Medications: Diuretic: furosemide 40 mg IV daily Beta Blocker: metoprolol tartrate 50 mg BID ACE/ARB/ARNI: lisinopril 5 mg daily  Prior to admission HF Medications: None  Pertinent Lab Values: Serum creatinine 0.77, BUN 15, Potassium 3.5, Sodium 133, Magnesium 1.8  Vital Signs: Weight: 130 lbs (admission weight: 131 lbs) Blood pressure: 120-130/80s  Heart rate: 80-110s  I/O: -1L yesterday; net -2.1L  Medication Assistance / Insurance Benefits Check: Does the patient have prescription insurance?  Yes Type of insurance plan: [employer] (out-of-network)  Outpatient Pharmacy:  Prior to admission outpatient pharmacy: [facility] Is the patient willing to use [facility] TOC pharmacy at discharge? Yes Is the patient willing to transition their outpatient pharmacy to utilize a [facility] outpatient pharmacy?   No - out of network insurance    Assessment: 1. 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 NYHA class II symptoms. - Continue furosemide 40 mg IV daily, may be able to transition to PO. Strict I/Os and daily weights. Keep K>4 and Mg>2. - Continue metoprolol tartrate 50 mg BID, consider increasing dose for further rate control - Consider transitioning lisinopril to losartan for eventual Entresto  optimization - Consider adding MRA and SGLT2i prior to discharge   Plan: 1) Medication changes recommended at this time: - Stop lisinopril and start losartan (bridge to [place]) - Increase metoprolol   2) Patient assistance: [employer] plan is out of network. Copays are higher when billed under [facility]  3)  Education  - Patient has been educated on current HF medications and potential additions to HF medication regimen - Patient verbalizes understanding that over the next few months, these medication doses may change and more medications may be added to optimize HF regimen - Patient has been educated on basic disease state pathophysiology and goals of therapy   Sharen Hones, PharmD, BCPS Heart Failure Stewardship Pharmacist Phone [phone]

## 2023-03-10 NOTE — Progress Notes (Signed)
Initial Nutrition Assessment  DOCUMENTATION CODES:   Not applicable  INTERVENTION:  Encourage adequate PO intake Ensure Enlive po once daily, each supplement provides 350 kcal and 20 grams of protein. Carnation Breakfast Essentials BID, each packet mixed with 8 ounces of 2% milk provides 13 grams of protein and 260 calories.  NUTRITION DIAGNOSIS:   Increased nutrient needs related to acute illness as evidenced by estimated needs.  GOAL:   Patient will meet greater than or equal to 90% of their needs  MONITOR:   PO intake, Supplement acceptance, Labs, Weight trends  REASON FOR ASSESSMENT:   Consult Assessment of nutrition requirement/status  ASSESSMENT:   Pt presented with SOB, chest discomfort, palpitations and leg swelling for >1 month. Found to have afib with RVR, new onset though likely chronic. PMH significant for HTN, HLD, paroxysmal SVT.  Per Cardiology, rates mildly elevated today, plan for TEE/DCCV inpatient vs outpatient.   Patient walking around room at time of visit. She reports that her appetite is much improved now that her heart rate is better controlled. She states that at home for about 1 month, she has had a poor appetite d/t shortness of breath and some chest pressure. She also recalls being under stress at home.   Per review of chart, pt noted to continue with tobacco and daily alcohol consumption (5-6 beers daily).   After paying bills she reports having $45 dollars left for food for the week which she reports is "easy" to do since it is only for her. She will cook a meal that will last about 2 days. She recalls a typical intake of 1 meal per day. Her daughter has been planning to purchase nutrition supplements for her to begin consuming at home. Provided ensure for her to try and will also order carnation instant breakfast. Encouraged her to add these into her daily regimen as feasible when she is unable to consume a meal.   Meal completions: 4/22: 100%  dinner 4/23: 50% breakfast  Pt is uncertain of her usual weight. She was surprised when she was weighed on admission and her weight was 131 lbs. She cannot recall whether she has lost or gained weight recently.   Medications: lasix  IV daily, klor-con  Labs: sodium 133   NUTRITION - FOCUSED PHYSICAL EXAM:  Flowsheet Row Most Recent Value  Orbital Region No depletion  Upper Arm Region Mild depletion  Thoracic and Lumbar Region No depletion  Buccal Region No depletion  Temple Region No depletion  Clavicle Bone Region No depletion  Clavicle and Acromion Bone Region No depletion  Scapular Bone Region No depletion  Dorsal Hand No depletion  Patellar Region Mild depletion  Anterior Thigh Region Mild depletion  Posterior Calf Region Mild depletion  Edema (RD Assessment) None  Hair Reviewed  Eyes Reviewed  Mouth Reviewed  Skin Reviewed  Nails Reviewed       Diet Order:   Diet Order             Diet Heart Room service appropriate? Yes; Fluid consistency: Thin; Fluid restriction: 2000 mL Fluid  Diet effective now                   EDUCATION NEEDS:   Education needs have been addressed  Skin:  Skin Assessment: Reviewed RN Assessment  Last BM:  4/21  Height:   Ht Readings from Last 1 Encounters:  03/09/23 5' (1.524 m)    Weight:   Wt Readings from Last 1 Encounters:  03/10/23 59 kg   BMI:  Body mass index is 25.39 kg/m.  Estimated Nutritional Needs:   Kcal:  1400-1600  Protein:  70-85g  Fluid:  >/=1.5L  Drusilla Kanner, RDN, LDN Clinical Nutrition

## 2023-03-11 ENCOUNTER — Inpatient Hospital Stay (HOSPITAL_COMMUNITY): Payer: BLUE CROSS/BLUE SHIELD | Admitting: Anesthesiology

## 2023-03-11 ENCOUNTER — Encounter (HOSPITAL_COMMUNITY)
Admission: EM | Disposition: A | Discharge status: Home / Self Care | Payer: Self-pay | Source: Home / Self Care | Attending: Internal Medicine

## 2023-03-11 ENCOUNTER — Other Ambulatory Visit (HOSPITAL_COMMUNITY): Payer: BLUE CROSS/BLUE SHIELD

## 2023-03-11 ENCOUNTER — Encounter (HOSPITAL_COMMUNITY): Payer: Self-pay | Admitting: Internal Medicine

## 2023-03-11 ENCOUNTER — Inpatient Hospital Stay (HOSPITAL_COMMUNITY): Payer: BLUE CROSS/BLUE SHIELD

## 2023-03-11 DIAGNOSIS — I4891 Unspecified atrial fibrillation: Secondary | ICD-10-CM

## 2023-03-11 DIAGNOSIS — I5021 Acute systolic (congestive) heart failure: Secondary | ICD-10-CM

## 2023-03-11 DIAGNOSIS — I4811 Longstanding persistent atrial fibrillation: Secondary | ICD-10-CM | POA: Diagnosis not present

## 2023-03-11 HISTORY — PX: TEE WITHOUT CARDIOVERSION: SHX5443

## 2023-03-11 HISTORY — PX: CARDIOVERSION: SHX1299

## 2023-03-11 LAB — CBC WITH DIFFERENTIAL/PLATELET
Abs Immature Granulocytes: 0.03 10*3/uL (ref 0.00–0.07)
Basophils Absolute: 0 10*3/uL (ref 0.0–0.1)
Basophils Relative: 0 %
Eosinophils Absolute: 0.2 10*3/uL (ref 0.0–0.5)
Eosinophils Relative: 2 %
HCT: 43.6 % (ref 36.0–46.0)
Hemoglobin: 14.6 g/dL (ref 12.0–15.0)
Immature Granulocytes: 0 %
Lymphocytes Relative: 24 %
Lymphs Abs: 2 10*3/uL (ref 0.7–4.0)
MCH: 30.6 pg (ref 26.0–34.0)
MCHC: 33.5 g/dL (ref 30.0–36.0)
MCV: 91.4 fL (ref 80.0–100.0)
Monocytes Absolute: 1.4 10*3/uL — ABNORMAL HIGH (ref 0.1–1.0)
Monocytes Relative: 16 %
Neutro Abs: 4.9 10*3/uL (ref 1.7–7.7)
Neutrophils Relative %: 58 %
Platelets: 292 10*3/uL (ref 150–400)
RBC: 4.77 MIL/uL (ref 3.87–5.11)
RDW: 14.5 % (ref 11.5–15.5)
WBC: 8.4 10*3/uL (ref 4.0–10.5)
nRBC: 0 % (ref 0.0–0.2)

## 2023-03-11 LAB — BASIC METABOLIC PANEL
Anion gap: 12 (ref 5–15)
BUN: 19 mg/dL (ref 8–23)
CO2: 27 mmol/L (ref 22–32)
Calcium: 9.1 mg/dL (ref 8.9–10.3)
Chloride: 94 mmol/L — ABNORMAL LOW (ref 98–111)
Creatinine, Ser: 0.74 mg/dL (ref 0.44–1.00)
GFR, Estimated: >60 mL/min (ref >60)
Glucose, Bld: 119 mg/dL — ABNORMAL HIGH (ref 70–99)
Potassium: 4.3 mmol/L (ref 3.5–5.1)
Sodium: 133 mmol/L — ABNORMAL LOW (ref 135–145)

## 2023-03-11 LAB — MAGNESIUM: Magnesium: 2 mg/dL (ref 1.7–2.4)

## 2023-03-11 LAB — PROTIME-INR
INR: 1.5 — ABNORMAL HIGH (ref 0.8–1.2)
Prothrombin Time: 17.5 seconds — ABNORMAL HIGH (ref 11.4–15.2)

## 2023-03-11 LAB — ECHO TEE

## 2023-03-11 SURGERY — ECHOCARDIOGRAM, TRANSESOPHAGEAL
Anesthesia: Monitor Anesthesia Care

## 2023-03-11 MED ORDER — EMPAGLIFLOZIN 10 MG PO TABS
10.0000 mg | ORAL_TABLET | Freq: Every day | ORAL | Status: DC
  Administered 2023-03-12: 10 mg via ORAL
  Filled 2023-03-11: qty 1

## 2023-03-11 MED ORDER — METOPROLOL TARTRATE 50 MG PO TABS
50.0000 mg | ORAL_TABLET | Freq: Two times a day (BID) | ORAL | Status: AC
  Administered 2023-03-11: 50 mg via ORAL
  Filled 2023-03-11: qty 1

## 2023-03-11 MED ORDER — EPHEDRINE SULFATE-NACL 50-0.9 MG/10ML-% IV SOSY
PREFILLED_SYRINGE | INTRAVENOUS | Status: DC | PRN
  Administered 2023-03-11: 5 mg via INTRAVENOUS

## 2023-03-11 MED ORDER — SPIRONOLACTONE 12.5 MG HALF TABLET
12.5000 mg | ORAL_TABLET | Freq: Every day | ORAL | Status: DC
  Administered 2023-03-11 – 2023-03-12 (×2): 12.5 mg via ORAL
  Filled 2023-03-11 (×2): qty 1

## 2023-03-11 MED ORDER — PROPOFOL 500 MG/50ML IV EMUL
INTRAVENOUS | Status: DC | PRN
  Administered 2023-03-11: 150 ug/kg/min via INTRAVENOUS

## 2023-03-11 MED ORDER — METOPROLOL SUCCINATE ER 50 MG PO TB24
50.0000 mg | ORAL_TABLET | Freq: Every day | ORAL | Status: DC
  Administered 2023-03-12: 50 mg via ORAL
  Filled 2023-03-11: qty 1

## 2023-03-11 MED ORDER — PHENYLEPHRINE 80 MCG/ML (10ML) SYRINGE FOR IV PUSH (FOR BLOOD PRESSURE SUPPORT)
PREFILLED_SYRINGE | INTRAVENOUS | Status: DC | PRN
  Administered 2023-03-11 (×4): 80 ug via INTRAVENOUS
  Administered 2023-03-11: 120 ug via INTRAVENOUS

## 2023-03-11 SURGICAL SUPPLY — 1 items: ELECT DEFIB PAD ADLT CADENCE (PAD) ×1 IMPLANT

## 2023-03-11 NOTE — Anesthesia Preprocedure Evaluation (Addendum)
Anesthesia Evaluation  Patient identified by MRN, date of birth, ID band Patient awake    Reviewed: Allergy & Precautions, NPO status , Patient's Chart, lab work & pertinent test results, reviewed documented beta blocker date and time   History of Anesthesia Complications Negative for: history of anesthetic complications  Airway Mallampati: II  TM Distance: >3 FB Neck ROM: Full    Dental  (+) Poor Dentition, Missing, Dental Advisory Given   Pulmonary Current Smoker and Patient abstained from smoking.   breath sounds clear to auscultation       Cardiovascular hypertension, Pt. on medications and Pt. on home beta blockers (-) angina + CAD (non-obstructive)  + dysrhythmias Atrial Fibrillation  Rhythm:Irregular Rate:Normal  03/09/2023 ECHO: EF 40-45%, mildly decreased LVF, global hypokinesis, mod LVH, mildly reduced RVF, normal PAP, trivial MR   Neuro/Psych negative neurological ROS     GI/Hepatic negative GI ROS, Neg liver ROS,,,  Endo/Other  negative endocrine ROS    Renal/GU negative Renal ROS     Musculoskeletal   Abdominal   Peds  Hematology eliquis   Anesthesia Other Findings   Reproductive/Obstetrics                             Anesthesia Physical Anesthesia Plan  ASA: 3  Anesthesia Plan: MAC   Post-op Pain Management: Minimal or no pain anticipated   Induction:   PONV Risk Score and Plan: 1  Airway Management Planned: Natural Airway and Nasal Cannula  Additional Equipment: None  Intra-op Plan:   Post-operative Plan:   Informed Consent: I have reviewed the patients History and Physical, chart, labs and discussed the procedure including the risks, benefits and alternatives for the proposed anesthesia with the patient or authorized representative who has indicated his/her understanding and acceptance.     Dental advisory given  Plan Discussed with: CRNA and  Surgeon  Anesthesia Plan Comments:         Anesthesia Quick Evaluation

## 2023-03-11 NOTE — Progress Notes (Signed)
Rounding Note    Patient Name: Jennifer Pennington Date of Encounter: 03/11/2023  [facility] Cardiologist: Chrystie Nose, MD   Subjective   I/Os not recorded.  Creatinine stable at 0.7.  Underwent TEE/DCCV today, converted to sinus rhythm.  She reports dyspnea has improved  Inpatient Medications    Scheduled Meds:  apixaban  5 mg Oral BID   aspirin EC  81 mg Oral Daily   atorvastatin  40 mg Oral Daily   feeding supplement  237 mL Oral BID BM   FLUoxetine  10 mg Oral Daily   furosemide  40 mg Intravenous Daily   losartan  25 mg Oral Daily   metoprolol tartrate  50 mg Oral BID   nicotine  14 mg Transdermal Daily   potassium chloride  20 mEq Oral Daily   sodium chloride flush  3 mL Intravenous Q12H   Continuous Infusions:  sodium chloride     PRN Meds: sodium chloride, acetaminophen, ipratropium, ondansetron (ZOFRAN) IV, sodium chloride flush   Vital Signs    Vitals:   03/11/23 1016 03/11/23 1145 03/11/23 1155 03/11/23 1205  BP: (!) 129/103 111/66 106/71 110/72  Pulse: (!) 102 72 65 70  Resp: 20 13 14 16   Temp:  98 F (36.7 C)    TempSrc:  Temporal    SpO2: 96% 93% 96% 96%  Weight:      Height:        Intake/Output Summary (Last 24 hours) at 03/11/2023 1236 Last data filed at 03/11/2023 1129 Gross per 24 hour  Intake 542.07 ml  Output --  Net 542.07 ml       03/11/2023    5:16 AM 03/10/2023    5:12 AM 03/09/2023    5:22 PM  Last 3 Weights  Weight (lbs) 131 lb 9.6 oz 130 lb 131 lb 2.8 oz  Weight (kg) 59.693 kg 58.968 kg 59.5 kg      Telemetry    NSR with frequent PACs- Personally Reviewed  ECG    No new ECG- Personally Reviewed  Physical Exam   GEN: No acute distress.   Neck: No JVD Cardiac: Irregular, tachycardic, no murmurs Respiratory: Clear to auscultation bilaterally. GI: Soft, nontender, non-distended  MS: 1+ edema Neuro:  Nonfocal  Psych: Normal affect   Labs    High Sensitivity Troponin:   Recent Labs  Lab  03/08/23 1437 03/08/23 1940  TROPONINIHS 53* 54*      Chemistry Recent Labs  Lab 03/08/23 1940 03/09/23 0151 03/10/23 0035 03/11/23 0045  NA 135 133* 133* 133*  K 4.4 3.7 3.5 4.3  CL 94* 93* 95* 94*  CO2 24 26 30 27   GLUCOSE 87 93 136* 119*  BUN 6* 7* 15 19  CREATININE 0.71 0.75 0.77 0.74  CALCIUM 9.7 9.0 8.7* 9.1  MG 1.8  --  1.8 2.0  PROT 7.5  --   --   --   ALBUMIN 3.6  --   --   --   AST 43*  --   --   --   ALT 33  --   --   --   ALKPHOS 112  --   --   --   BILITOT 0.8  --   --   --   GFRNONAA >60 >60 >60 >60  ANIONGAP 17* 14 8 12      Lipids No results for input(s): "CHOL", "TRIG", "HDL", "LABVLDL", "LDLCALC", "CHOLHDL" in the last 168 hours.  Hematology Recent Labs  Lab  03/08/23 1448 03/08/23 1649 03/09/23 0151 03/11/23 0045  WBC 11.2*  --  9.3 8.4  RBC 5.26*  --  4.74 4.77  HGB 16.2* 16.0* 14.5 14.6  HCT 49.1* 47.0* 44.0 43.6  MCV 93.3  --  92.8 91.4  MCH 30.8  --  30.6 30.6  MCHC 33.0  --  33.0 33.5  RDW 14.6  --  14.5 14.5  PLT 297  --  290 292    Thyroid  Recent Labs  Lab 03/08/23 1940  TSH 2.183     BNPNo results for input(s): "BNP", "PROBNP" in the last 168 hours.  DDimer No results for input(s): "DDIMER" in the last 168 hours.   Radiology    EP STUDY  Result Date: 03/11/2023 See surgical note for result.  ECHOCARDIOGRAM COMPLETE  Result Date: 03/09/2023    ECHOCARDIOGRAM REPORT   [name] Date of Exam: 03/09/2023 Medical Rec #:  [id number]       Height:       60.0 in Accession #:    [id number]      Weight:       140.0 lb Date of Birth:  [birth year]      BSA:          1.604 m Patient Age:    66 years        BP:           108/68 mmHg Patient Gender: F               HR:           93 bpm. Exam Location:  Inpatient Procedure: 2D Echo, Cardiac Doppler and Color Doppler Indications:    atrial fibrillation  History:        Patient has prior history of Echocardiogram examinations, most                 recent 03/11/2022. CAD;  Risk Factors:Hypertension, Dyslipidemia                 and Current Smoker.  Sonographer:    Dondra Prader RVT Referring Phys: Emeline General IMPRESSIONS  1. Left ventricular ejection fraction, by estimation, is 40 to 45%. The left ventricle has mildly decreased function. The left ventricle demonstrates global hypokinesis. There is moderate concentric left ventricular hypertrophy. Left ventricular diastolic parameters are indeterminate.  2. Right ventricular systolic function is mildly reduced. The right ventricular size is normal. There is normal pulmonary artery systolic pressure. The estimated right ventricular systolic pressure is 25.1 mmHg.  3. Left atrial size was moderately dilated.  4. Right atrial size was mildly dilated.  5. The mitral valve is normal in structure. Trivial mitral valve regurgitation. No evidence of mitral stenosis.  6. The aortic valve is tricuspid. There is mild calcification of the aortic valve. Aortic valve regurgitation is not visualized. No aortic stenosis is present.  7. The inferior vena cava is normal in size with greater than 50% respiratory variability, suggesting right atrial pressure of 3 mmHg.  8. The patient was in atrial fibrillation. FINDINGS  Left Ventricle: Left ventricular ejection fraction, by estimation, is 40 to 45%. The left ventricle has mildly decreased function. The left ventricle demonstrates global hypokinesis. The left ventricular internal cavity size was normal in size. There is  moderate concentric left ventricular hypertrophy. Left ventricular diastolic parameters are indeterminate. Right Ventricle: The right ventricular size is normal. No increase in right ventricular wall thickness. Right ventricular systolic function is mildly  reduced. There is normal pulmonary artery systolic pressure. The tricuspid regurgitant velocity is 2.35 m/s, and with an assumed right atrial pressure of 3 mmHg, the estimated right ventricular systolic pressure is 25.1 mmHg. Left  Atrium: Left atrial size was moderately dilated. Right Atrium: Right atrial size was mildly dilated. Pericardium: There is no evidence of pericardial effusion. Mitral Valve: The mitral valve is normal in structure. There is mild calcification of the mitral valve leaflet(s). Mild mitral annular calcification. Trivial mitral valve regurgitation. No evidence of mitral valve stenosis. Tricuspid Valve: The tricuspid valve is normal in structure. Tricuspid valve regurgitation is trivial. Aortic Valve: The aortic valve is tricuspid. There is mild calcification of the aortic valve. Aortic valve regurgitation is not visualized. No aortic stenosis is present. Aortic valve mean gradient measures 3.0 mmHg. Aortic valve peak gradient measures 7.1 mmHg. Aortic valve area, by VTI measures 1.80 cm. Pulmonic Valve: The pulmonic valve was normal in structure. Pulmonic valve regurgitation is trivial. Aorta: The aortic root is normal in size and structure. Venous: The inferior vena cava is normal in size with greater than 50% respiratory variability, suggesting right atrial pressure of 3 mmHg. IAS/Shunts: No atrial level shunt detected by color flow Doppler.  LEFT VENTRICLE PLAX 2D LVIDd:         4.50 cm   Diastology LVIDs:         3.90 cm   LV e' medial:    5.85 cm/s LV PW:         1.50 cm   LV E/e' medial:  18.5 LV IVS:        1.20 cm   LV e' lateral:   6.30 cm/s LVOT diam:     1.90 cm   LV E/e' lateral: 17.1 LV SV:         36 LV SV Index:   23 LVOT Area:     2.84 cm  RIGHT VENTRICLE            IVC RV Basal diam:  3.40 cm    IVC diam: 1.50 cm RV Mid diam:    2.60 cm RV S prime:     8.18 cm/s TAPSE (M-mode): 1.3 cm LEFT ATRIUM             Index        RIGHT ATRIUM           Index LA Vol (A2C):   73.2 ml 45.63 ml/m  RA Area:     20.90 cm LA Vol (A4C):   53.8 ml 33.54 ml/m  RA Volume:   57.40 ml  35.78 ml/m LA Biplane Vol: 65.9 ml 41.08 ml/m  AORTIC VALVE                    PULMONIC VALVE AV Area (Vmax):    2.03 cm     PV Vmax:        0.79 m/s AV Area (Vmean):   1.98 cm     PV Peak grad:  2.5 mmHg AV Area (VTI):     1.80 cm AV Vmax:           133.00 cm/s AV Vmean:          83.400 cm/s AV VTI:            0.202 m AV Peak Grad:      7.1 mmHg AV Mean Grad:      3.0 mmHg LVOT Vmax:  95.00 cm/s LVOT Vmean:        58.100 cm/s LVOT VTI:          0.128 m LVOT/AV VTI ratio: 0.63  AORTA Ao Root diam: 2.90 cm Ao Arch diam: 2.7 cm MITRAL VALVE                TRICUSPID VALVE MV Area (PHT): 4.24 cm     TR Peak grad:   22.1 mmHg MV Decel Time: 179 msec     TR Vmax:        235.00 cm/s MV E velocity: 108.00 cm/s MV A velocity: 31.10 cm/s   SHUNTS MV E/A ratio:  3.47         Systemic VTI:  0.13 m                             Systemic Diam: 1.90 cm Dalton McleanMD Electronically signed by Wilfred Lacy Signature Date/Time: 03/09/2023/5:09:19 PM    Final     Cardiac Studies     Patient Profile     66 y.o. female with a hx of gout, tobacco use, ETOH use, nonobstructive CAD, MAT, and hyperlipidemia who is being seen 03/09/2023 for the evaluation of atrial fibrillation .   Assessment & Plan    New onset atrial fibrillation with RVR: Reports palpitations as well as dyspnea and fatigue for the past several months.  Was noted to be in new onset atrial fibrillation with RVR with a heart rate in the 160s on admission.  She was initially placed on Cardizem drip with heart rates improved.  This was stopped and transition to metoprolol 50 mg twice daily.  Echo shows EF 40-45%, mild RV dysfunction, no significant valve disease -- Eliquis 5 mg twice daily -- Continue metoprolol -- s/p successful TEE/DCCV today, now in sinus with PACs.     Acute HFrEF: Echo 02/2022 with preserved LVEF, grade 3 diastolic dysfunction.  BNP 383, chest x-ray with small effusion.  Echo this admission shows EF 40 to 45%.  -- Volume status improved, received IV lasix today.  Will plan to transition to PO tomorrow -- Continue metoprolol, will switch to toprol XL --  Continue losartan with plans to eventually bridge to Entresto -- Add spironolactone 12.5 mg daily today, likely add Jardiance tomorrow   Hypertension -- Continue metoprolol 50 mg twice daily.  Switched from lisinopril 5 mg daily to losartan 25 mg daily   Hyperlipidemia -- Continue atorvastatin 40 mg daily   For questions or updates, please contact  [facility] Please consult [email or web address] for contact info under        Signed, Little Ishikawa, MD  03/11/2023, 12:36 PM

## 2023-03-11 NOTE — Progress Notes (Signed)
  Echocardiogram Echocardiogram Transesophageal has been performed.  Delcie Roch 03/11/2023, 11:56 AM

## 2023-03-11 NOTE — Progress Notes (Signed)
Patient converted back to a.fib after cardioversion. Dr. Duke Salvia aware, no new orders

## 2023-03-11 NOTE — Progress Notes (Signed)
PROGRESS NOTE  Late entry.  Patient was seen in the morning rounds at 8 AM.  Jennifer Pennington  WUJ:[id number] DOB: [birth year] DOA: 03/08/2023 PCP: Patient, No Pcp Per    Brief Narrative:  67 year old with history of hypertension, hyperlipidemia, paroxysmal SVT previously on metoprolol, currently not on any maintenance treatment for at least 6 months presents to the emergency room with ongoing shortness of breath, chest discomfort, palpitations and leg swelling for more than 1 month.  She was trying to follow-up with her primary care physician, appointment was in June only.  Her symptoms were worsening.  So she came to the emergency room. In the emergency room, hemodynamically stable.  EKG with A-fib with RVR with initial heart rate 170.  Troponin 57.  Started on Cardizem drip with controlled heart rate.  Admitted to the hospital.   Assessment & Plan:   A-fib with RVR, new onset.  Previous history of paroxysmal SVT.  Likely chronic A-fib. Initially on Cardizem infusion.  Currently on metoprolol 50 mg twice daily.  Cardiology planning to change to Toprol-XL 50 mg daily.  Heparin converted to Eliquis. Electrolytes are adequate. D-dimer 0.9 TSH 2.1. Due to persistent A-fib, planning for DCCV cardioversion today.  Acute systolic heart failure: Likely tachycardia mediated. Presented with orthopnea, PND, shortness of breath, she has S3 gallop on exam. Continue on Lasix 40 mg IV daily, not on diuretics at home. Lisinopril 5 mg-switching to losartan with plans for Entresto. Toprol-XL for 2. Aspirin and a statin. Followed by cardiology.  Elevated troponins, likely demand ischemia with tachycardia.  History of nonobstructive coronary artery disease, CT coronary 2023 with nonobstructive coronary artery disease.  Continue aspirin and statin.  Smoker: Counseled to quit.  Nicotine patch.  She is motivated.  Anxiety/depression: Patient expresses depression symptoms including poor motivation and  poor sleep.  Started low-dose fluoxetine.  Will prescribe on discharge.      DVT prophylaxis:  apixaban (ELIQUIS) tablet 5 mg   Code Status: Full code Family Communication: 2 daughters at the bedside. Disposition Plan: Status is: Inpatient Remains inpatient appropriate because: A-fib, new onset congestive heart failure     Consultants:  Cardiology  Procedures:  DCCV  Antimicrobials:  None   Subjective:  Patient seen and examined.  I examined her in the morning rounds.  She was A-fib with heart rate of 110-120.  Patient has some dry cough but denies any obvious shortness of breath.  Was getting ready to go to procedure.   Objective: Vitals:   03/11/23 1016 03/11/23 1145 03/11/23 1155 03/11/23 1205  BP: (!) 129/103 111/66 106/71 110/72  Pulse: (!) 102 72 65 70  Resp: 20 13 14 16   Temp:  98 F (36.7 C)    TempSrc:  Temporal    SpO2: 96% 93% 96% 96%  Weight:      Height:        Intake/Output Summary (Last 24 hours) at 03/11/2023 1307 Last data filed at 03/11/2023 1129 Gross per 24 hour  Intake 422.07 ml  Output --  Net 422.07 ml   Filed Weights   03/09/23 1722 03/10/23 0512 03/11/23 0516  Weight: 59.5 kg 59 kg 59.7 kg    Examination:  General: Looks fairly comfortable on room air.  Cardiovascular: S1-S2 normal.  S3 gallop present.  Irregularly irregular. Respiratory: Bilateral poor air entry at bases.  Not in any distress. On room air.  Gastrointestinal: Soft.  Nontender.  Bowel sound present. Ext: 1+ edema.  No cyanosis or clubbing. Neuro: Alert and  oriented.  No focal deficits. Musculoskeletal: No deformities.     Data Reviewed: I have personally reviewed following labs and imaging studies  CBC: Recent Labs  Lab 03/08/23 1448 03/08/23 1649 03/09/23 0151 03/11/23 0045  WBC 11.2*  --  9.3 8.4  NEUTROABS 6.6  --   --  4.9  HGB 16.2* 16.0* 14.5 14.6  HCT 49.1* 47.0* 44.0 43.6  MCV 93.3  --  92.8 91.4  PLT 297  --  290 292   Basic Metabolic  Panel: Recent Labs  Lab 03/08/23 1649 03/08/23 1940 03/09/23 0151 03/10/23 0035 03/11/23 0045  NA 132* 135 133* 133* 133*  K 3.7 4.4 3.7 3.5 4.3  CL 96* 94* 93* 95* 94*  CO2  --  GLUCOSE 95 87 93 136* 119*  BUN 6* 6* 7* 15 19  CREATININE 0.60 0.71 0.75 0.77 0.74  CALCIUM  --  9.7 9.0 8.7* 9.1  MG  --  1.8  --  1.8 2.0   GFR: Estimated Creatinine Clearance: 57.4 mL/min (by C-G formula based on SCr of 0.74 mg/dL). Liver Function Tests: Recent Labs  Lab 03/08/23 1940  AST 43*  ALT 33  ALKPHOS 112  BILITOT 0.8  PROT 7.5  ALBUMIN 3.6   No results for input(s): "LIPASE", "AMYLASE" in the last 168 hours. No results for input(s): "AMMONIA" in the last 168 hours. Coagulation Profile: Recent Labs  Lab 03/08/23 1505 03/11/23 0045  INR 1.1 1.5*   Cardiac Enzymes: No results for input(s): "CKTOTAL", "CKMB", "CKMBINDEX", "TROPONINI" in the last 168 hours. BNP (last 3 results) No results for input(s): "PROBNP" in the last 8760 hours. HbA1C: No results for input(s): "HGBA1C" in the last 72 hours. CBG: No results for input(s): "GLUCAP" in the last 168 hours. Lipid Profile: No results for input(s): "CHOL", "HDL", "LDLCALC", "TRIG", "CHOLHDL", "LDLDIRECT" in the last 72 hours. Thyroid Function Tests: Recent Labs    03/08/23 1940  TSH 2.183  T3FREE 3.0   Anemia Panel: No results for input(s): "VITAMINB12", "FOLATE", "FERRITIN", "TIBC", "IRON", "RETICCTPCT" in the last 72 hours. Sepsis Labs: No results for input(s): "PROCALCITON", "LATICACIDVEN" in the last 168 hours.  No results found for this or any previous visit (from the past 240 hour(s)).       Radiology Studies: ECHO TEE  Result Date: 03/11/2023    TRANSESOPHOGEAL ECHO REPORT   [name] Date of Exam: 03/11/2023 Medical Rec #:  [id number]       Height:       60.0 in Accession #:    [id number]      Weight:       131.6 lb Date of Birth:  1956-01-17      BSA:          1.562 m  Patient Age:    66 years        BP:           97/65 mmHg Patient Gender: F               HR:           96 bpm. Exam Location:  Inpatient Procedure: Transesophageal Echo, Color Doppler and Cardiac Doppler Indications:     atrial fibrillation  History:         Patient has prior history of Echocardiogram examinations, most                  recent 03/09/2023. CAD, Arrythmias:Atrial Fibrillation; Risk  Factors:Current Smoker and Dyslipidemia.  Sonographer:     Delcie Roch RDCS Referring Phys:  [id number] Roe Rutherford [facility] Diagnosing Phys: Chilton Si MD PROCEDURE: After discussion of the risks and benefits of a TEE, an informed consent was obtained from the patient. The transesophogeal probe was passed without difficulty through the esophogus of the patient. Imaged were obtained with the patient in a left lateral decubitus position. Sedation performed by different physician. The patient was monitored while under deep sedation. Anesthestetic sedation was provided intravenously by Anesthesiology: 237mg  of Propofol. The patient's vital signs; including heart rate, blood pressure, and oxygen saturation; remained stable throughout the procedure. The patient developed no complications during the procedure. A successful direct current cardioversion was performed at 150 joules with 1 attempt.  IMPRESSIONS  1. Left ventricular ejection fraction, by estimation, is 30 to 35%. The left ventricle has moderately decreased function. The left ventricle demonstrates global hypokinesis.  2. Right ventricular systolic function is normal. The right ventricular size is normal.  3. Left atrial size was moderately dilated. No left atrial/left atrial appendage thrombus was detected.  4. Right atrial size was moderately dilated.  5. The mitral valve is normal in structure. Trivial mitral valve regurgitation. No evidence of mitral stenosis.  6. The aortic valve is tricuspid. Aortic valve regurgitation is not visualized. No  aortic stenosis is present.  7. There is mild (Grade II) layered plaque involving the descending aorta.  8. The inferior vena cava is normal in size with greater than 50% respiratory variability, suggesting right atrial pressure of 3 mmHg. Conclusion(s)/Recommendation(s): Normal biventricular function without evidence of hemodynamically significant valvular heart disease. FINDINGS  Left Ventricle: Left ventricular ejection fraction, by estimation, is 30 to 35%. The left ventricle has moderately decreased function. The left ventricle demonstrates global hypokinesis. The left ventricular internal cavity size was normal in size. There is no left ventricular hypertrophy. Right Ventricle: The right ventricular size is normal. No increase in right ventricular wall thickness. Right ventricular systolic function is normal. Left Atrium: Left atrial size was moderately dilated. No left atrial/left atrial appendage thrombus was detected. Right Atrium: Right atrial size was moderately dilated. Pericardium: There is no evidence of pericardial effusion. Mitral Valve: The mitral valve is normal in structure. Trivial mitral valve regurgitation. No evidence of mitral valve stenosis. Tricuspid Valve: The tricuspid valve is normal in structure. Tricuspid valve regurgitation is mild . No evidence of tricuspid stenosis. Aortic Valve: The aortic valve is tricuspid. Aortic valve regurgitation is not visualized. No aortic stenosis is present. Pulmonic Valve: The pulmonic valve was normal in structure. Pulmonic valve regurgitation is not visualized. No evidence of pulmonic stenosis. Aorta: The aortic root is normal in size and structure. There is mild (Grade II) layered plaque involving the descending aorta. Venous: The inferior vena cava is normal in size with greater than 50% respiratory variability, suggesting right atrial pressure of 3 mmHg. IAS/Shunts: No atrial level shunt detected by color flow Doppler.  TRICUSPID VALVE TR Peak grad:    32.7 mmHg TR Vmax:        286.00 cm/s Chilton Si MD Electronically signed by Chilton Si MD Signature Date/Time: 03/11/2023/12:38:58 PM    Final    EP STUDY  Result Date: 03/11/2023 See surgical note for result.  ECHOCARDIOGRAM COMPLETE  Result Date: 03/09/2023    ECHOCARDIOGRAM REPORT   Patient Name:   Shakala Marlatt Date of Exam: 03/09/2023 Medical Rec #:  [id number]       Height:  60.0 in Accession #:    [id number]      Weight:       140.0 lb Date of Birth:  16-Jan-1956      BSA:          1.604 m Patient Age:    66 years        BP:           108/68 mmHg Patient Gender: F               HR:           93 bpm. Exam Location:  Inpatient Procedure: 2D Echo, Cardiac Doppler and Color Doppler Indications:    atrial fibrillation  History:        Patient has prior history of Echocardiogram examinations, most                 recent 03/11/2022. CAD; Risk Factors:Hypertension, Dyslipidemia                 and Current Smoker.  Sonographer:    Dondra Prader RVT Referring Phys: Emeline General IMPRESSIONS  1. Left ventricular ejection fraction, by estimation, is 40 to 45%. The left ventricle has mildly decreased function. The left ventricle demonstrates global hypokinesis. There is moderate concentric left ventricular hypertrophy. Left ventricular diastolic parameters are indeterminate.  2. Right ventricular systolic function is mildly reduced. The right ventricular size is normal. There is normal pulmonary artery systolic pressure. The estimated right ventricular systolic pressure is 25.1 mmHg.  3. Left atrial size was moderately dilated.  4. Right atrial size was mildly dilated.  5. The mitral valve is normal in structure. Trivial mitral valve regurgitation. No evidence of mitral stenosis.  6. The aortic valve is tricuspid. There is mild calcification of the aortic valve. Aortic valve regurgitation is not visualized. No aortic stenosis is present.  7. The inferior vena cava is normal in size with greater than  50% respiratory variability, suggesting right atrial pressure of 3 mmHg.  8. The patient was in atrial fibrillation. FINDINGS  Left Ventricle: Left ventricular ejection fraction, by estimation, is 40 to 45%. The left ventricle has mildly decreased function. The left ventricle demonstrates global hypokinesis. The left ventricular internal cavity size was normal in size. There is  moderate concentric left ventricular hypertrophy. Left ventricular diastolic parameters are indeterminate. Right Ventricle: The right ventricular size is normal. No increase in right ventricular wall thickness. Right ventricular systolic function is mildly reduced. There is normal pulmonary artery systolic pressure. The tricuspid regurgitant velocity is 2.35 m/s, and with an assumed right atrial pressure of 3 mmHg, the estimated right ventricular systolic pressure is 25.1 mmHg. Left Atrium: Left atrial size was moderately dilated. Right Atrium: Right atrial size was mildly dilated. Pericardium: There is no evidence of pericardial effusion. Mitral Valve: The mitral valve is normal in structure. There is mild calcification of the mitral valve leaflet(s). Mild mitral annular calcification. Trivial mitral valve regurgitation. No evidence of mitral valve stenosis. Tricuspid Valve: The tricuspid valve is normal in structure. Tricuspid valve regurgitation is trivial. Aortic Valve: The aortic valve is tricuspid. There is mild calcification of the aortic valve. Aortic valve regurgitation is not visualized. No aortic stenosis is present. Aortic valve mean gradient measures 3.0 mmHg. Aortic valve peak gradient measures 7.1 mmHg. Aortic valve area, by VTI measures 1.80 cm. Pulmonic Valve: The pulmonic valve was normal in structure. Pulmonic valve regurgitation is trivial. Aorta: The aortic root is normal in size and structure.  Venous: The inferior vena cava is normal in size with greater than 50% respiratory variability, suggesting right atrial  pressure of 3 mmHg. IAS/Shunts: No atrial level shunt detected by color flow Doppler.  LEFT VENTRICLE PLAX 2D LVIDd:         4.50 cm   Diastology LVIDs:         3.90 cm   LV e' medial:    5.85 cm/s LV PW:         1.50 cm   LV E/e' medial:  18.5 LV IVS:        1.20 cm   LV e' lateral:   6.30 cm/s LVOT diam:     1.90 cm   LV E/e' lateral: 17.1 LV SV:         36 LV SV Index:   23 LVOT Area:     2.84 cm  RIGHT VENTRICLE            IVC RV Basal diam:  3.40 cm    IVC diam: 1.50 cm RV Mid diam:    2.60 cm RV S prime:     8.18 cm/s TAPSE (M-mode): 1.3 cm LEFT ATRIUM             Index        RIGHT ATRIUM           Index LA Vol (A2C):   73.2 ml 45.63 ml/m  RA Area:     20.90 cm LA Vol (A4C):   53.8 ml 33.54 ml/m  RA Volume:   57.40 ml  35.78 ml/m LA Biplane Vol: 65.9 ml 41.08 ml/m  AORTIC VALVE                    PULMONIC VALVE AV Area (Vmax):    2.03 cm     PV Vmax:       0.79 m/s AV Area (Vmean):   1.98 cm     PV Peak grad:  2.5 mmHg AV Area (VTI):     1.80 cm AV Vmax:           133.00 cm/s AV Vmean:          83.400 cm/s AV VTI:            0.202 m AV Peak Grad:      7.1 mmHg AV Mean Grad:      3.0 mmHg LVOT Vmax:         95.00 cm/s LVOT Vmean:        58.100 cm/s LVOT VTI:          0.128 m LVOT/AV VTI ratio: 0.63  AORTA Ao Root diam: 2.90 cm Ao Arch diam: 2.7 cm MITRAL VALVE                TRICUSPID VALVE MV Area (PHT): 4.24 cm     TR Peak grad:   22.1 mmHg MV Decel Time: 179 msec     TR Vmax:        235.00 cm/s MV E velocity: 108.00 cm/s MV A velocity: 31.10 cm/s   SHUNTS MV E/A ratio:  3.47         Systemic VTI:  0.13 m                             Systemic Diam: 1.90 cm Dalton McleanMD Electronically signed by Wilfred Lacy Signature Date/Time: 03/09/2023/5:09:19 PM    Final  Scheduled Meds:  apixaban  5 mg Oral BID   aspirin EC  81 mg Oral Daily   atorvastatin  40 mg Oral Daily   [START ON 03/12/2023] empagliflozin  10 mg Oral Daily   feeding supplement  237 mL Oral BID BM   FLUoxetine  10 mg  Oral Daily   losartan  25 mg Oral Daily   [START ON 03/12/2023] metoprolol succinate  50 mg Oral Daily   metoprolol tartrate  50 mg Oral BID   nicotine  14 mg Transdermal Daily   sodium chloride flush  3 mL Intravenous Q12H   spironolactone  12.5 mg Oral Daily   Continuous Infusions:  sodium chloride       LOS: 3 days    Time spent: 35  minutes    Dorcas Carrow, MD [facility] Pager [phone]

## 2023-03-11 NOTE — Progress Notes (Signed)
   Heart Failure Stewardship Pharmacist Progress Note   PCP: Patient, No Pcp Per PCP-Cardiologist: Chrystie Nose, MD    HPI:  67 yo F with PMH of MAT, SVT, and CAD.   Presented to the ED on 4/21 with chest pain, LE edema, and palpitations. CXR with enlarged cardiopericardial silhouette, tiny right pleural effusion. Found to be in afib RVR with rates 170s and started on cardizem drip and later transitioned to metoprolol when rates improved. ECHO showed LVEF 40-45%, global hypokinesis, moderate concentric LVH, and RV mildly reduced. S/p successful cardioversion on 4/24. Now in sinus with PACs.  Current HF Medications: Beta Blocker: metoprolol XL 50 mg daily ACE/ARB/ARNI: losartan 25 mg daily MRA: spironolactone 12.5 mg daily SGLT2i: Jardiance 10 mg daily  Prior to admission HF Medications: None  Pertinent Lab Values: Serum creatinine 0.74, BUN 19, Potassium 4.3, Sodium 133, Magnesium 2.0  Vital Signs: Weight: 131 lbs (admission weight: 131 lbs) Blood pressure: 110-130/80s  Heart rate: 80-110s  I/O: net -1.6L  Medication Assistance / Insurance Benefits Check: Does the patient have prescription insurance?  Yes Type of insurance plan: [employer] risk [employer] (out-of-network)  Outpatient Pharmacy:  Prior to admission outpatient pharmacy: [facility] Is the patient willing to use [facility] TOC pharmacy at discharge? Yes Is the patient willing to transition their outpatient pharmacy to utilize a [facility] outpatient pharmacy?   No - out of network insurance    Assessment: 1. 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 NYHA class II symptoms. - Off IV lasix. Plan to transition to PO lasix tomorrow. Strict I/Os and daily weights. Keep K>4 and Mg>2. - Agree with transitioning to metoprolol XL 50 mg daily - Continue losartan 25 mg daily, plan for eventual transition to [facility]  - Agree with adding spironolactone  12.5 mg daily - Agree with adding Jardiance 10 mg daily   Plan: 1) Medication changes recommended at this time: - Agree with changes  2) Patient assistance: [employer] plan is out of network. Copays are higher when billed under [facility] - Can use monthly copay cards for medications  3)  Education  - Patient has been educated on current HF medications and potential additions to HF medication regimen - Patient verbalizes understanding that over the next few months, these medication doses may change and more medications may be added to optimize HF regimen - Patient has been educated on basic disease state pathophysiology and goals of therapy   Sharen Hones, PharmD, BCPS Heart Failure Stewardship Pharmacist Phone [phone]

## 2023-03-11 NOTE — Interval H&P Note (Signed)
History and Physical Interval Note:  03/11/2023 10:31 AM  Jennifer Pennington  has presented today for surgery, with the diagnosis of afib.  The various methods of treatment have been discussed with the patient and family. After consideration of risks, benefits and other options for treatment, the patient has consented to  Procedure(s): TRANSESOPHAGEAL ECHOCARDIOGRAM (N/A) CARDIOVERSION (N/A) as a surgical intervention.  The patient's history has been reviewed, patient examined, no change in status, stable for surgery.  I have reviewed the patient's chart and labs.  Questions were answered to the patient's satisfaction.     Chilton Si, MD

## 2023-03-11 NOTE — Transfer of Care (Signed)
Immediate Anesthesia Transfer of Care Note  Patient: Jennifer Pennington  Procedure(s) Performed: TRANSESOPHAGEAL ECHOCARDIOGRAM CARDIOVERSION  Patient Location: PACU  Anesthesia Type:MAC  Level of Consciousness: awake, patient cooperative, and responds to stimulation  Airway & Oxygen Therapy: Patient Spontanous Breathing and Patient connected to nasal cannula oxygen  Post-op Assessment: Report given to RN and Post -op Vital signs reviewed and stable  Post vital signs: Reviewed and stable  Last Vitals:  Vitals Value Taken Time  BP 111/66 03/11/23 1145  Temp 36.7 C 03/11/23 1145  Pulse 72 03/11/23 1145  Resp 13 03/11/23 1145  SpO2 93 % 03/11/23 1145    Last Pain:  Vitals:   03/11/23 1145  TempSrc: Temporal  PainSc: 0-No pain      Patients Stated Pain Goal: 2 (03/10/23 1632)  Complications: No notable events documented.

## 2023-03-11 NOTE — CV Procedure (Signed)
Brief TEE Note  LVEF 30-35%.  Global hypokinesis.  No LA/LAA thrombus or masses Mild TR Trivial MR.  Trivial PR. Aortic atherosclerosis.   For additional details see full report.  Electrical Cardioversion Procedure Note Jennifer Pennington [id number] 02/25/1956  Procedure: Electrical Cardioversion Indications:  Atrial Fibrillation  Procedure Details Consent: Risks of procedure as well as the alternatives and risks of each were explained to the (patient/caregiver).  Consent for procedure obtained. Time Out: Verified patient identification, verified procedure, site/side was marked, verified correct patient position, special equipment/implants available, medications/allergies/relevent history reviewed, required imaging and test results available.  Performed  Patient placed on cardiac monitor, pulse oximetry, supplemental oxygen as necessary.  Sedation given:  propofol Pacer pads placed anterior and posterior chest.  Cardioverted 1 time(s).  Cardioverted at 150J.  Evaluation Findings: Post procedure EKG shows: NSR Complications: None Patient did tolerate procedure well.   Chilton Si, MD 03/11/2023, 11:30 AM

## 2023-03-11 NOTE — Anesthesia Postprocedure Evaluation (Signed)
Anesthesia Post Note  Patient: Jennifer Pennington  Procedure(s) Performed: TRANSESOPHAGEAL ECHOCARDIOGRAM CARDIOVERSION     Patient location during evaluation: Cath Lab Anesthesia Type: MAC Level of consciousness: awake and alert, patient cooperative and oriented Pain management: pain level controlled Vital Signs Assessment: post-procedure vital signs reviewed and stable Respiratory status: spontaneous breathing, nonlabored ventilation and respiratory function stable Cardiovascular status: blood pressure returned to baseline and stable Postop Assessment: no apparent nausea or vomiting Anesthetic complications: no   No notable events documented.  Last Vitals:  Vitals:   03/11/23 1155 03/11/23 1205  BP: 106/71 110/72  Pulse: 65 70  Resp: 14 16  Temp:    SpO2: 96% 96%    Last Pain:  Vitals:   03/11/23 1205  TempSrc:   PainSc: 0-No pain                 Eathen Budreau,E. Caelum Federici

## 2023-03-11 NOTE — Progress Notes (Signed)
Mobility Specialist Progress Note:   03/11/23 0930  Mobility  Activity Ambulated independently in hallway  Level of Assistance Independent  Assistive Device None  Distance Ambulated (ft) 500 ft  Activity Response Tolerated well  Mobility Referral Yes  $Mobility charge 1 Mobility   Pt agreeable to mobility session. Required no physical assistance throughout. HR peaked at 135bpm during ambulation. Pt c/o SOB, SpO2 WFL. Pt left sitting EOB with all needs met, eager for cardioversion.   Addison Lank Mobility Specialist Please contact via SecureChat or  Rehab office at [phone]

## 2023-03-12 ENCOUNTER — Other Ambulatory Visit (HOSPITAL_COMMUNITY): Payer: Self-pay

## 2023-03-12 DIAGNOSIS — I4819 Other persistent atrial fibrillation: Secondary | ICD-10-CM | POA: Diagnosis not present

## 2023-03-12 DIAGNOSIS — I4891 Unspecified atrial fibrillation: Secondary | ICD-10-CM

## 2023-03-12 DIAGNOSIS — I5021 Acute systolic (congestive) heart failure: Secondary | ICD-10-CM

## 2023-03-12 LAB — BASIC METABOLIC PANEL
Anion gap: 9 (ref 5–15)
BUN: 22 mg/dL (ref 8–23)
CO2: 31 mmol/L (ref 22–32)
Calcium: 9.1 mg/dL (ref 8.9–10.3)
Chloride: 96 mmol/L — ABNORMAL LOW (ref 98–111)
Creatinine, Ser: 0.96 mg/dL (ref 0.44–1.00)
GFR, Estimated: >60 mL/min (ref >60)
Glucose, Bld: 131 mg/dL — ABNORMAL HIGH (ref 70–99)
Potassium: 4.7 mmol/L (ref 3.5–5.1)
Sodium: 136 mmol/L (ref 135–145)

## 2023-03-12 LAB — MAGNESIUM: Magnesium: 2.3 mg/dL (ref 1.7–2.4)

## 2023-03-12 MED ORDER — FUROSEMIDE 40 MG PO TABS
40.0000 mg | ORAL_TABLET | Freq: Every day | ORAL | 11 refills | Status: DC | PRN
  Filled 2023-03-12: qty 30, 30d supply, fill #0

## 2023-03-12 MED ORDER — NICOTINE 14 MG/24HR TD PT24
14.0000 mg | MEDICATED_PATCH | Freq: Every day | TRANSDERMAL | 0 refills | Status: AC
  Filled 2023-03-12: qty 28, 28d supply, fill #0

## 2023-03-12 MED ORDER — METOPROLOL SUCCINATE ER 50 MG PO TB24
50.0000 mg | ORAL_TABLET | Freq: Every day | ORAL | 0 refills | Status: DC
  Filled 2023-03-12: qty 30, 30d supply, fill #0

## 2023-03-12 MED ORDER — ATORVASTATIN CALCIUM 40 MG PO TABS
40.0000 mg | ORAL_TABLET | Freq: Every day | ORAL | 0 refills | Status: DC
  Filled 2023-03-12: qty 30, 30d supply, fill #0

## 2023-03-12 MED ORDER — FLUOXETINE HCL 10 MG PO CAPS
10.0000 mg | ORAL_CAPSULE | Freq: Every day | ORAL | 0 refills | Status: AC
  Filled 2023-03-12: qty 30, 30d supply, fill #0

## 2023-03-12 MED ORDER — EMPAGLIFLOZIN 10 MG PO TABS
10.0000 mg | ORAL_TABLET | Freq: Every day | ORAL | 0 refills | Status: DC
  Filled 2023-03-12: qty 30, 30d supply, fill #0

## 2023-03-12 MED ORDER — SPIRONOLACTONE 25 MG PO TABS
12.5000 mg | ORAL_TABLET | Freq: Every day | ORAL | 0 refills | Status: DC
  Filled 2023-03-12: qty 15, 30d supply, fill #0

## 2023-03-12 MED ORDER — LOSARTAN POTASSIUM 25 MG PO TABS
25.0000 mg | ORAL_TABLET | Freq: Every day | ORAL | 0 refills | Status: DC
  Filled 2023-03-12: qty 30, 30d supply, fill #0

## 2023-03-12 MED ORDER — APIXABAN 5 MG PO TABS
5.0000 mg | ORAL_TABLET | Freq: Two times a day (BID) | ORAL | 0 refills | Status: DC
  Filled 2023-03-12: qty 60, 30d supply, fill #0

## 2023-03-12 NOTE — Progress Notes (Signed)
Rounding Note    Patient Name: Jennifer Pennington Date of Encounter: 03/12/2023  [facility] Cardiologist: Chrystie Nose, MD   Subjective   I/Os not recorded.  Creatinine 0.96.  Denies any chest pain.  Reports dyspnea has improved  Inpatient Medications    Scheduled Meds:  apixaban  5 mg Oral BID   aspirin EC  81 mg Oral Daily   atorvastatin  40 mg Oral Daily   empagliflozin  10 mg Oral Daily   feeding supplement  237 mL Oral BID BM   FLUoxetine  10 mg Oral Daily   losartan  25 mg Oral Daily   metoprolol succinate  50 mg Oral Daily   nicotine  14 mg Transdermal Daily   sodium chloride flush  3 mL Intravenous Q12H   spironolactone  12.5 mg Oral Daily   Continuous Infusions:  sodium chloride     PRN Meds: sodium chloride, acetaminophen, ipratropium, ondansetron (ZOFRAN) IV, sodium chloride flush   Vital Signs    Vitals:   03/11/23 1936 03/12/23 0011 03/12/23 0523 03/12/23 0739  BP: 112/77 139/73 125/85 128/84  Pulse:   78 76  Resp: Temp: 98.7 F (37.1 C) 98.1 F (36.7 C) 98.8 F (37.1 C) 98.7 F (37.1 C)  TempSrc: Oral Oral Oral Oral  SpO2: 96% 99% 93%   Weight:   59.1 kg   Height:        Intake/Output Summary (Last 24 hours) at 03/12/2023 1030 Last data filed at 03/11/2023 2100 Gross per 24 hour  Intake 663 ml  Output --  Net 663 ml       03/12/2023    5:23 AM 03/11/2023    5:16 AM 03/10/2023    5:12 AM  Last 3 Weights  Weight (lbs) 130 lb 4.8 oz 131 lb 9.6 oz 130 lb  Weight (kg) 59.104 kg 59.693 kg 58.968 kg      Telemetry    NSR with frequent PACs- Personally Reviewed  ECG    No new ECG- Personally Reviewed  Physical Exam   GEN: No acute distress.   Neck: No JVD Cardiac: RRR, no murmurs Respiratory: Clear to auscultation bilaterally. GI: Soft, nontender, non-distended  MS: Trace edema Neuro:  Nonfocal  Psych: Normal affect   Labs    High Sensitivity Troponin:   Recent Labs  Lab 03/08/23 1437  03/08/23 1940  TROPONINIHS 53* 54*      Chemistry Recent Labs  Lab 03/08/23 1940 03/09/23 0151 03/10/23 0035 03/11/23 0045 03/12/23 0045  NA 135   < > 133* 133* 136  K 4.4   < > 3.5 4.3 4.7  CL 94*   < > 95* 94* 96*  CO2 24   < > GLUCOSE 87   < > 136* 119* 131*  BUN 6*   < > CREATININE 0.71   < > 0.77 0.74 0.96  CALCIUM 9.7   < > 8.7* 9.1 9.1  MG 1.8  --  1.8 2.0 2.3  PROT 7.5  --   --   --   --   ALBUMIN 3.6  --   --   --   --   AST 43*  --   --   --   --   ALT 33  --   --   --   --   ALKPHOS 112  --   --   --   --  BILITOT 0.8  --   --   --   --   GFRNONAA >60   < > >60 >60 >60  ANIONGAP 17*   < > 8 12 9    < > = values in this interval not displayed.     Lipids No results for input(s): "CHOL", "TRIG", "HDL", "LABVLDL", "LDLCALC", "CHOLHDL" in the last 168 hours.  Hematology Recent Labs  Lab 03/08/23 1448 03/08/23 1649 03/09/23 0151 03/11/23 0045  WBC 11.2*  --  9.3 8.4  RBC 5.26*  --  4.74 4.77  HGB 16.2* 16.0* 14.5 14.6  HCT 49.1* 47.0* 44.0 43.6  MCV 93.3  --  92.8 91.4  MCH 30.8  --  30.6 30.6  MCHC 33.0  --  33.0 33.5  RDW 14.6  --  14.5 14.5  PLT 297  --  290 292    Thyroid  Recent Labs  Lab 03/08/23 1940  TSH 2.183     BNPNo results for input(s): "BNP", "PROBNP" in the last 168 hours.  DDimer No results for input(s): "DDIMER" in the last 168 hours.   Radiology    ECHO TEE  Result Date: 03/11/2023    TRANSESOPHOGEAL ECHO REPORT   [name] Date of Exam: 03/11/2023 Medical Rec #:  [id number]       Height:       60.0 in Accession #:    [id number]      Weight:       131.6 lb Date of Birth:  1956-07-08      BSA:          1.562 m Patient Age:    66 years        BP:           97/65 mmHg Patient Gender: F               HR:           96 bpm. Exam Location:  Inpatient Procedure: Transesophageal Echo, Color Doppler and Cardiac Doppler Indications:     atrial fibrillation  History:         Patient has prior  history of Echocardiogram examinations, most                  recent 03/09/2023. CAD, Arrythmias:Atrial Fibrillation; Risk                  Factors:Current Smoker and Dyslipidemia.  Sonographer:     Delcie Roch RDCS Referring Phys:  [id number] Roe Rutherford [facility] Diagnosing Phys: Chilton Si MD PROCEDURE: After discussion of the risks and benefits of a TEE, an informed consent was obtained from the patient. The transesophogeal probe was passed without difficulty through the esophogus of the patient. Imaged were obtained with the patient in a left lateral decubitus position. Sedation performed by different physician. The patient was monitored while under deep sedation. Anesthestetic sedation was provided intravenously by Anesthesiology: 237mg  of Propofol. The patient's vital signs; including heart rate, blood pressure, and oxygen saturation; remained stable throughout the procedure. The patient developed no complications during the procedure. A successful direct current cardioversion was performed at 150 joules with 1 attempt.  IMPRESSIONS  1. Left ventricular ejection fraction, by estimation, is 30 to 35%. The left ventricle has moderately decreased function. The left ventricle demonstrates global hypokinesis.  2. Right ventricular systolic function is normal. The right ventricular size is normal.  3. Left atrial size was moderately dilated. No left atrial/left atrial appendage thrombus  was detected.  4. Right atrial size was moderately dilated.  5. The mitral valve is normal in structure. Trivial mitral valve regurgitation. No evidence of mitral stenosis.  6. The aortic valve is tricuspid. Aortic valve regurgitation is not visualized. No aortic stenosis is present.  7. There is mild (Grade II) layered plaque involving the descending aorta.  8. The inferior vena cava is normal in size with greater than 50% respiratory variability, suggesting right atrial pressure of 3 mmHg. Conclusion(s)/Recommendation(s):  Normal biventricular function without evidence of hemodynamically significant valvular heart disease. FINDINGS  Left Ventricle: Left ventricular ejection fraction, by estimation, is 30 to 35%. The left ventricle has moderately decreased function. The left ventricle demonstrates global hypokinesis. The left ventricular internal cavity size was normal in size. There is no left ventricular hypertrophy. Right Ventricle: The right ventricular size is normal. No increase in right ventricular wall thickness. Right ventricular systolic function is normal. Left Atrium: Left atrial size was moderately dilated. No left atrial/left atrial appendage thrombus was detected. Right Atrium: Right atrial size was moderately dilated. Pericardium: There is no evidence of pericardial effusion. Mitral Valve: The mitral valve is normal in structure. Trivial mitral valve regurgitation. No evidence of mitral valve stenosis. Tricuspid Valve: The tricuspid valve is normal in structure. Tricuspid valve regurgitation is mild . No evidence of tricuspid stenosis. Aortic Valve: The aortic valve is tricuspid. Aortic valve regurgitation is not visualized. No aortic stenosis is present. Pulmonic Valve: The pulmonic valve was normal in structure. Pulmonic valve regurgitation is not visualized. No evidence of pulmonic stenosis. Aorta: The aortic root is normal in size and structure. There is mild (Grade II) layered plaque involving the descending aorta. Venous: The inferior vena cava is normal in size with greater than 50% respiratory variability, suggesting right atrial pressure of 3 mmHg. IAS/Shunts: No atrial level shunt detected by color flow Doppler.  TRICUSPID VALVE TR Peak grad:   32.7 mmHg TR Vmax:        286.00 cm/s Chilton Si MD Electronically signed by Chilton Si MD Signature Date/Time: 03/11/2023/12:38:58 PM    Final    EP STUDY  Result Date: 03/11/2023 See surgical note for result.   Cardiac Studies     Patient Profile      67 y.o. female with a hx of gout, tobacco use, ETOH use, nonobstructive CAD, MAT, and hyperlipidemia who is being seen 03/09/2023 for the evaluation of atrial fibrillation .   Assessment & Plan    New onset atrial fibrillation with RVR: Reports palpitations as well as dyspnea and fatigue for the past several months.  Was noted to be in new onset atrial fibrillation with RVR with a heart rate in the 160s on admission.  She was initially placed on Cardizem drip with heart rates improved.  This was stopped and transition to metoprolol 50 mg twice daily.  Echo shows EF 40-45%, mild RV dysfunction, no significant valve disease -- Eliquis 5 mg twice daily -- Continue metoprolol -- s/p successful TEE/DCCV 4/24, now in sinus with PACs.     Acute HFrEF: Echo 02/2022 with preserved LVEF, grade 3 diastolic dysfunction.  BNP 383, chest x-ray with small effusion.  Echo this admission shows EF 40 to 45%.  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  TEE 4/24 showed EF 30 to 35%, normal RV function, moderate left atrial enlargement, moderate right atrial enlargement, no significant valvular disease. -- Continue metoprolol, will switch to toprol XL -- Continue losartan with plans to eventually bridge to  Entresto -- Added spironolactone 12.5 mg daily 4/24, will add Jardiance today -- May not need scheduled Lasix with adding Jardiance.  Would discharge on as needed Lasix 40 mg.  Would asked to monitor daily weights and take Lasix if gains more than 3 pounds in 1 day or 5 pounds in 1 week.  Currently appears euvolemic --Plan repeat echocardiogram in 3 months.  If systolic function remains reduced despite maintaining sinus rhythm, will need further workup for etiology of cardiomyopathy including ischemic evaluation   Hypertension -- Continue metoprolol and losartan and spironolactone as above   Hyperlipidemia -- Continue atorvastatin 40 mg daily   [facility] will sign off.    Medication Recommendations: Eliquis 5 mg twice daily, Toprol-XL 50 mg daily, losartan 25 mg daily, spironolactone 12.5 mg daily, Jardiance 10 mg daily, atorvastatin 40 mg daily.  Can stop aspirin with starting Eliquis.  Would also monitor daily weights and prescribe as needed Lasix 40 mg if gains more than 3 pounds in 1 day or 5 pounds in 1 week Other recommendations (labs, testing, etc): BMET in 1 week Follow up as an outpatient: Scheduled for 4/30   For questions or updates, please contact [facility] Please consult [email or web address] for contact info under        Signed, Little Ishikawa, MD  03/12/2023, 10:30 AM

## 2023-03-12 NOTE — Discharge Summary (Signed)
Physician Discharge Summary  Jennifer Pennington ONG:[id number] DOB: Jun 01, 1956 DOA: 03/08/2023  PCP: Patient, No Pcp Per  Admit date: 03/08/2023 Discharge date: 03/12/2023  Admitted From: Home Disposition: Home  Recommendations for Outpatient Follow-up:  Follow up with PCP in 1-2 weeks Please obtain BMP/CBC in one week Cardiology to schedule follow-up  Home Health: N/A Equipment/Devices: N/A  Discharge Condition: Stable CODE STATUS: Full code Diet recommendation: Low-salt and low-carb diet,  Discharge summary: 67 year old with history of hypertension, hyperlipidemia, paroxysmal SVT previously on metoprolol, currently not on any maintenance treatment for at least 6 months presented to the emergency room with ongoing shortness of breath, chest discomfort, palpitations and leg swelling for more than 1 month.  She was trying to follow-up with her primary care physician, appointment was in June only.  Her symptoms were worsening.  So she came to the emergency room. In the emergency room, hemodynamically stable.  EKG with A-fib with RVR with initial heart rate 170.  Troponin 57.  Started on Cardizem drip with controlled heart rate.  Admitted to the hospital.    # A-fib with RVR, new onset.  Previous history of paroxysmal SVT.  Likely chronic A-fib. Initially on Cardizem infusion, now on metoprolol.  Underwent DCCV with successful sinus rhythm. Electrolytes are adequate. D-dimer 0.9 TSH 2.1. Going home with Toprol-XL 50 mg daily, Eliquis 5 mg twice daily.  Currently sinus rhythm and rate controlled.   # Acute systolic heart failure: Likely tachycardia mediated. Presented with orthopnea, PND, shortness of breath, she has S3 gallop on exam. Treated with IV Lasix with good clinical response. Currently euvolemic on exam.  Symptomatically improved. Discharged with Losartan, Toprol-XL, Jardiance, spironolactone.  Lasix as needed. Already on aspirin and statin. Cardiology will schedule  follow-up.  # Elevated troponins, likely demand ischemia with tachycardia.  History of nonobstructive coronary artery disease, CT coronary 2023 with nonobstructive coronary artery disease.  Continue aspirin and statin.   # Smoker: Counseled to quit.  Nicotine patch.  She is motivated.   # Anxiety/depression: Patient expressed depression symptoms including poor motivation and poor sleep.  Started low-dose fluoxetine.  Will prescribe on discharge.  Medically stable for discharge.  Discharge Diagnoses:  Principal Problem:   Atrial flutter Active Problems:   Atrial fibrillation with RVR   Cardiac asthma   Acute on chronic diastolic heart failure   Acute systolic heart failure   Persistent atrial fibrillation    Discharge Instructions  Discharge Instructions     (HEART FAILURE PATIENTS) Call MD:  Anytime you have any of the following symptoms: 1) 3 pound weight gain in 24 hours or 5 pounds in 1 week 2) shortness of breath, with or without a dry hacking cough 3) swelling in the hands, feet or stomach 4) if you have to sleep on extra pillows at night in order to breathe.   Complete by: As directed    Call MD for:  difficulty breathing, headache or visual disturbances   Complete by: As directed    Diet - low sodium heart healthy   Complete by: As directed    Increase activity slowly   Complete by: As directed       Allergies as of 03/12/2023       Reactions   Flagyl [metronidazole] Diarrhea, Nausea And Vomiting        Medication List     STOP taking these medications    aspirin EC 81 MG tablet   metoprolol tartrate 25 MG tablet Commonly known as: [employer]  nitroGLYCERIN 0.4 MG SL tablet Commonly known as: NITROSTAT       TAKE these medications    acetaminophen 325 MG tablet Commonly known as: TYLENOL Take 650 mg by mouth every 6 (six) hours as needed for mild pain, moderate pain or headache.   apixaban 5 MG Tabs tablet Commonly known as: ELIQUIS Take 1  tablet (5 mg total) by mouth 2 (two) times daily.   atorvastatin 40 MG tablet Commonly known as: LIPITOR Take 1 tablet (40 mg total) by mouth daily. Please call to schedule an overdue appointment with cardiology for refills, [phone], thank you. 2ND attempt.   empagliflozin 10 MG Tabs tablet Commonly known as: JARDIANCE Take 1 tablet (10 mg total) by mouth daily. Start taking on: March 13, 2023   FLUoxetine 10 MG capsule Commonly known as: PROZAC Take 1 capsule (10 mg total) by mouth daily. Start taking on: March 13, 2023   furosemide 40 MG tablet Commonly known as: Lasix Take 1 tablet (40 mg total) by mouth daily as needed for fluid or edema (swelling of legs, weight gain >3 lbs in one day).   losartan 25 MG tablet Commonly known as: COZAAR Take 1 tablet (25 mg total) by mouth daily. Start taking on: March 13, 2023   metoprolol succinate 50 MG 24 hr tablet Commonly known as: TOPROL-XL Take 1 tablet (50 mg total) by mouth daily. Take with or immediately following a meal. Start taking on: March 13, 2023   nicotine 14 mg/24hr patch Commonly known as: NICODERM CQ - dosed in mg/24 hours Place 1 patch (14 mg total) onto the skin daily. Start taking on: March 13, 2023   spironolactone 25 MG tablet Commonly known as: ALDACTONE Take 0.5 tablets (12.5 mg total) by mouth daily. Start taking on: March 13, 2023        Follow-up Information     [facility]. Go in 20 day(s).   Specialty: Cardiology Why: Hospital follow up 03/30/2023 @ 9 am PLEASE bring a curent medication list to appointment FREE valet parking, Entrance C, off [employer] information: [address] [id number] mc [place] [address] [phone]               Allergies  Allergen Reactions   Flagyl [Metronidazole] Diarrhea and Nausea And Vomiting    Consultations: Cardiology   Procedures/Studies: ECHO  TEE  Result Date: 03/25/2023    TRANSESOPHOGEAL ECHO REPORT   [name] Date of Exam: 03/25/2023 Medical Rec #:  [id number]       Height:       60.0 in Accession #:    [id number]      Weight:       131.6 lb Date of Birth:  04-24-1956      BSA:          1.562 m Patient Age:    66 years        BP:           97/65 mmHg Patient Gender: F               HR:           96 bpm. Exam Location:  Inpatient Procedure: Transesophageal Echo, Color Doppler and Cardiac Doppler Indications:     atrial fibrillation  History:         Patient has prior history of Echocardiogram examinations, most  recent 03/09/2023. CAD, Arrythmias:Atrial Fibrillation; Risk                  Factors:Current Smoker and Dyslipidemia.  Sonographer:     Delcie Roch RDCS Referring Phys:  [id number] Roe Rutherford [facility] Diagnosing Phys: Chilton Si MD PROCEDURE: After discussion of the risks and benefits of a TEE, an informed consent was obtained from the patient. The transesophogeal probe was passed without difficulty through the esophogus of the patient. Imaged were obtained with the patient in a left lateral decubitus position. Sedation performed by different physician. The patient was monitored while under deep sedation. Anesthestetic sedation was provided intravenously by Anesthesiology: 237mg  of Propofol. The patient's vital signs; including heart rate, blood pressure, and oxygen saturation; remained stable throughout the procedure. The patient developed no complications during the procedure. A successful direct current cardioversion was performed at 150 joules with 1 attempt.  IMPRESSIONS  1. Left ventricular ejection fraction, by estimation, is 30 to 35%. The left ventricle has moderately decreased function. The left ventricle demonstrates global hypokinesis.  2. Right ventricular systolic function is normal. The right ventricular size is normal.  3. Left atrial size was moderately dilated. No left  atrial/left atrial appendage thrombus was detected.  4. Right atrial size was moderately dilated.  5. The mitral valve is normal in structure. Trivial mitral valve regurgitation. No evidence of mitral stenosis.  6. The aortic valve is tricuspid. Aortic valve regurgitation is not visualized. No aortic stenosis is present.  7. There is mild (Grade II) layered plaque involving the descending aorta.  8. The inferior vena cava is normal in size with greater than 50% respiratory variability, suggesting right atrial pressure of 3 mmHg. Conclusion(s)/Recommendation(s): Normal biventricular function without evidence of hemodynamically significant valvular heart disease. FINDINGS  Left Ventricle: Left ventricular ejection fraction, by estimation, is 30 to 35%. The left ventricle has moderately decreased function. The left ventricle demonstrates global hypokinesis. The left ventricular internal cavity size was normal in size. There is no left ventricular hypertrophy. Right Ventricle: The right ventricular size is normal. No increase in right ventricular wall thickness. Right ventricular systolic function is normal. Left Atrium: Left atrial size was moderately dilated. No left atrial/left atrial appendage thrombus was detected. Right Atrium: Right atrial size was moderately dilated. Pericardium: There is no evidence of pericardial effusion. Mitral Valve: The mitral valve is normal in structure. Trivial mitral valve regurgitation. No evidence of mitral valve stenosis. Tricuspid Valve: The tricuspid valve is normal in structure. Tricuspid valve regurgitation is mild . No evidence of tricuspid stenosis. Aortic Valve: The aortic valve is tricuspid. Aortic valve regurgitation is not visualized. No aortic stenosis is present. Pulmonic Valve: The pulmonic valve was normal in structure. Pulmonic valve regurgitation is not visualized. No evidence of pulmonic stenosis. Aorta: The aortic root is normal in size and structure. There is mild  (Grade II) layered plaque involving the descending aorta. Venous: The inferior vena cava is normal in size with greater than 50% respiratory variability, suggesting right atrial pressure of 3 mmHg. IAS/Shunts: No atrial level shunt detected by color flow Doppler.  TRICUSPID VALVE TR Peak grad:   32.7 mmHg TR Vmax:        286.00 cm/s Chilton Si MD Electronically signed by Chilton Si MD Signature Date/Time: 03/11/2023/12:38:58 PM    Final    EP STUDY  Result Date: 03/11/2023 See surgical note for result.  ECHOCARDIOGRAM COMPLETE  Result Date: 03/09/2023    ECHOCARDIOGRAM REPORT   Patient Name:  Jennifer Pennington Date of Exam: 03/09/2023 Medical Rec #:  [id number]       Height:       60.0 in Accession #:    [id number]      Weight:       140.0 lb Date of Birth:  Jul 17, 1956      BSA:          1.604 m Patient Age:    66 years        BP:           108/68 mmHg Patient Gender: F               HR:           93 bpm. Exam Location:  Inpatient Procedure: 2D Echo, Cardiac Doppler and Color Doppler Indications:    atrial fibrillation  History:        Patient has prior history of Echocardiogram examinations, most                 recent 03/11/2022. CAD; Risk Factors:Hypertension, Dyslipidemia                 and Current Smoker.  Sonographer:    Dondra Prader RVT Referring Phys: Emeline General IMPRESSIONS  1. Left ventricular ejection fraction, by estimation, is 40 to 45%. The left ventricle has mildly decreased function. The left ventricle demonstrates global hypokinesis. There is moderate concentric left ventricular hypertrophy. Left ventricular diastolic parameters are indeterminate.  2. Right ventricular systolic function is mildly reduced. The right ventricular size is normal. There is normal pulmonary artery systolic pressure. The estimated right ventricular systolic pressure is 25.1 mmHg.  3. Left atrial size was moderately dilated.  4. Right atrial size was mildly dilated.  5. The mitral valve is normal in  structure. Trivial mitral valve regurgitation. No evidence of mitral stenosis.  6. The aortic valve is tricuspid. There is mild calcification of the aortic valve. Aortic valve regurgitation is not visualized. No aortic stenosis is present.  7. The inferior vena cava is normal in size with greater than 50% respiratory variability, suggesting right atrial pressure of 3 mmHg.  8. The patient was in atrial fibrillation. FINDINGS  Left Ventricle: Left ventricular ejection fraction, by estimation, is 40 to 45%. The left ventricle has mildly decreased function. The left ventricle demonstrates global hypokinesis. The left ventricular internal cavity size was normal in size. There is  moderate concentric left ventricular hypertrophy. Left ventricular diastolic parameters are indeterminate. Right Ventricle: The right ventricular size is normal. No increase in right ventricular wall thickness. Right ventricular systolic function is mildly reduced. There is normal pulmonary artery systolic pressure. The tricuspid regurgitant velocity is 2.35 m/s, and with an assumed right atrial pressure of 3 mmHg, the estimated right ventricular systolic pressure is 25.1 mmHg. Left Atrium: Left atrial size was moderately dilated. Right Atrium: Right atrial size was mildly dilated. Pericardium: There is no evidence of pericardial effusion. Mitral Valve: The mitral valve is normal in structure. There is mild calcification of the mitral valve leaflet(s). Mild mitral annular calcification. Trivial mitral valve regurgitation. No evidence of mitral valve stenosis. Tricuspid Valve: The tricuspid valve is normal in structure. Tricuspid valve regurgitation is trivial. Aortic Valve: The aortic valve is tricuspid. There is mild calcification of the aortic valve. Aortic valve regurgitation is not visualized. No aortic stenosis is present. Aortic valve mean gradient measures 3.0 mmHg. Aortic valve peak gradient measures 7.1 mmHg. Aortic valve area, by VTI  measures 1.80  cm. Pulmonic Valve: The pulmonic valve was normal in structure. Pulmonic valve regurgitation is trivial. Aorta: The aortic root is normal in size and structure. Venous: The inferior vena cava is normal in size with greater than 50% respiratory variability, suggesting right atrial pressure of 3 mmHg. IAS/Shunts: No atrial level shunt detected by color flow Doppler.  LEFT VENTRICLE PLAX 2D LVIDd:         4.50 cm   Diastology LVIDs:         3.90 cm   LV e' medial:    5.85 cm/s LV PW:         1.50 cm   LV E/e' medial:  18.5 LV IVS:        1.20 cm   LV e' lateral:   6.30 cm/s LVOT diam:     1.90 cm   LV E/e' lateral: 17.1 LV SV:         36 LV SV Index:   23 LVOT Area:     2.84 cm  RIGHT VENTRICLE            IVC RV Basal diam:  3.40 cm    IVC diam: 1.50 cm RV Mid diam:    2.60 cm RV S prime:     8.18 cm/s TAPSE (M-mode): 1.3 cm LEFT ATRIUM             Index        RIGHT ATRIUM           Index LA Vol (A2C):   73.2 ml 45.63 ml/m  RA Area:     20.90 cm LA Vol (A4C):   53.8 ml 33.54 ml/m  RA Volume:   57.40 ml  35.78 ml/m LA Biplane Vol: 65.9 ml 41.08 ml/m  AORTIC VALVE                    PULMONIC VALVE AV Area (Vmax):    2.03 cm     PV Vmax:       0.79 m/s AV Area (Vmean):   1.98 cm     PV Peak grad:  2.5 mmHg AV Area (VTI):     1.80 cm AV Vmax:           133.00 cm/s AV Vmean:          83.400 cm/s AV VTI:            0.202 m AV Peak Grad:      7.1 mmHg AV Mean Grad:      3.0 mmHg LVOT Vmax:         95.00 cm/s LVOT Vmean:        58.100 cm/s LVOT VTI:          0.128 m LVOT/AV VTI ratio: 0.63  AORTA Ao Root diam: 2.90 cm Ao Arch diam: 2.7 cm MITRAL VALVE                TRICUSPID VALVE MV Area (PHT): 4.24 cm     TR Peak grad:   22.1 mmHg MV Decel Time: 179 msec     TR Vmax:        235.00 cm/s MV E velocity: 108.00 cm/s MV A velocity: 31.10 cm/s   SHUNTS MV E/A ratio:  3.47         Systemic VTI:  0.13 m  Systemic Diam: 1.90 cm Dalton McleanMD Electronically signed by Wilfred Lacy Signature Date/Time: 03/09/2023/5:09:19 PM    Final    DG Chest Portable 1 View  Result Date: 03/08/2023 CLINICAL DATA:  Chest pain EXAM: PORTABLE CHEST 1 VIEW COMPARISON:  X-ray 03/10/2022 and CT angiogram FINDINGS: Enlarged cardiopericardial silhouette. Tiny pleural effusion on the right. Likely chronic interstitial changes. No pneumothorax, edema. Overlapping cardiac leads IMPRESSION: Enlarged cardiopericardial silhouette. Tiny right effusion. Chronic lung changes Electronically Signed   By: Karen Kays M.D.   On: 03/08/2023 15:23   (Echo, Carotid, EGD, Colonoscopy, ERCP)    Subjective: Patient seen and examined.  No overnight events.  Up about in the hallway.  Denies any chest pain palpitations.   Discharge Exam: Vitals:   03/12/23 0523 03/12/23 0739  BP: 125/85 128/84  Pulse: 78 76  Resp: 17 19  Temp: 98.8 F (37.1 C) 98.7 F (37.1 C)  SpO2: 93%    Vitals:   03/11/23 1936 03/12/23 0011 03/12/23 0523 03/12/23 0739  BP: 112/77 139/73 125/85 128/84  Pulse:   78 76  Resp: 18 18 17 19   Temp: 98.7 F (37.1 C) 98.1 F (36.7 C) 98.8 F (37.1 C) 98.7 F (37.1 C)  TempSrc: Oral Oral Oral Oral  SpO2: 96% 99% 93%   Weight:   59.1 kg   Height:        General: Pt is alert, awake, not in acute distress Cardiovascular: RRR, S1/S2 +, no rubs, no gallops Respiratory: CTA bilaterally, no wheezing, no rhonchi Abdominal: Soft, NT, ND, bowel sounds + Extremities: no edema, no cyanosis    The results of significant diagnostics from this hospitalization (including imaging, microbiology, ancillary and laboratory) are listed below for reference.     Microbiology: No results found for this or any previous visit (from the past 240 hour(s)).   Labs: BNP (last 3 results) No results for input(s): "BNP" in the last 8760 hours. Basic Metabolic Panel: Recent Labs  Lab 03/08/23 1940 03/09/23 0151 03/10/23 0035 03/11/23 0045 03/12/23 0045  NA 135 133* 133* 133* 136  K 4.4  3.7 3.5 4.3 4.7  CL 94* 93* 95* 94* 96*  CO2 24 26 30 27 31   GLUCOSE 87 93 136* 119* 131*  BUN 6* 7* 15 19 22   CREATININE 0.71 0.75 0.77 0.74 0.96  CALCIUM 9.7 9.0 8.7* 9.1 9.1  MG 1.8  --  1.8 2.0 2.3   Liver Function Tests: Recent Labs  Lab 03/08/23 1940  AST 43*  ALT 33  ALKPHOS 112  BILITOT 0.8  PROT 7.5  ALBUMIN 3.6   No results for input(s): "LIPASE", "AMYLASE" in the last 168 hours. No results for input(s): "AMMONIA" in the last 168 hours. CBC: Recent Labs  Lab 03/08/23 1448 03/08/23 1649 03/09/23 0151 03/11/23 0045  WBC 11.2*  --  9.3 8.4  NEUTROABS 6.6  --   --  4.9  HGB 16.2* 16.0* 14.5 14.6  HCT 49.1* 47.0* 44.0 43.6  MCV 93.3  --  92.8 91.4  PLT 297  --  290 292   Cardiac Enzymes: No results for input(s): "CKTOTAL", "CKMB", "CKMBINDEX", "TROPONINI" in the last 168 hours. BNP: Invalid input(s): "POCBNP" CBG: No results for input(s): "GLUCAP" in the last 168 hours. D-Dimer No results for input(s): "DDIMER" in the last 72 hours. Hgb A1c No results for input(s): "HGBA1C" in the last 72 hours. Lipid Profile No results for input(s): "CHOL", "HDL", "LDLCALC", "TRIG", "CHOLHDL", "LDLDIRECT" in the last 72 hours. Thyroid function  studies No results for input(s): "TSH", "T4TOTAL", "T3FREE", "THYROIDAB" in the last 72 hours.  Invalid input(s): "FREET3" Anemia work up No results for input(s): "VITAMINB12", "FOLATE", "FERRITIN", "TIBC", "IRON", "RETICCTPCT" in the last 72 hours. Urinalysis    Component Value Date/Time   COLORURINE COLORLESS (A) 03/08/2023 2045   APPEARANCEUR CLEAR 03/08/2023 2045   LABSPEC 1.004 (L) 03/08/2023 2045   PHURINE 7.0 03/08/2023 2045   GLUCOSEU NEGATIVE 03/08/2023 2045   HGBUR SMALL (A) 03/08/2023 2045   BILIRUBINUR NEGATIVE 03/08/2023 2045   KETONESUR NEGATIVE 03/08/2023 2045   PROTEINUR NEGATIVE 03/08/2023 2045   NITRITE NEGATIVE 03/08/2023 2045   LEUKOCYTESUR NEGATIVE 03/08/2023 2045   Sepsis Labs Recent Labs  Lab  03/08/23 1448 03/09/23 0151 03/11/23 0045  WBC 11.2* 9.3 8.4   Microbiology No results found for this or any previous visit (from the past 240 hour(s)).   Time coordinating discharge: 35 minutes  SIGNED:   Dorcas Carrow, MD  [facility] 03/12/2023, 11:01 AM

## 2023-03-12 NOTE — TOC Transition Note (Signed)
Transition of Care [facility]) - CM/SW Discharge Note   Patient Details  Name: Jennifer Pennington MRN: [id number] Date of Birth: Dec 22, 1955  Transition of Care [facility] CM/SW Contact:  Leone Haven, RN Phone Number: 03/12/2023, 12:09 PM   Clinical Narrative:    Patient is for dc today, she will be on eliquis, TOC to fill her medications and NCM gave 10 copay card to dc Staff Nurse to give to patient.  Her daughter will transport her home today.     Final next level of care: Home/Self Care     Patient Goals and CMS Choice      Discharge Placement                         Discharge Plan and Services Additional resources added to the After Visit Summary for                                       Social Determinants of Health (SDOH) Interventions SDOH Screenings   Food Insecurity: No Food Insecurity (03/09/2023)  Housing: Low Risk  (03/09/2023)  Transportation Needs: No Transportation Needs (03/09/2023)  Utilities: Not At Risk (03/09/2023)  Alcohol Screen: Low Risk  (03/10/2023)  Tobacco Use: High Risk (03/11/2023)     Readmission Risk Interventions     No data to display

## 2023-03-12 NOTE — Progress Notes (Signed)
   Heart Failure Stewardship Pharmacist Progress Note   PCP: Patient, No Pcp Per PCP-Cardiologist: Chrystie Nose, MD    HPI:  67 yo F with PMH of MAT, SVT, and CAD.   Presented to the ED on 4/21 with chest pain, LE edema, and palpitations. CXR with enlarged cardiopericardial silhouette, tiny right pleural effusion. Found to be in afib RVR with rates 170s and started on cardizem drip and later transitioned to metoprolol when rates improved. ECHO showed LVEF 40-45%, global hypokinesis, moderate concentric LVH, and RV mildly reduced. S/p successful cardioversion on 4/24. Now in sinus with PACs.  Current HF Medications: Beta Blocker: metoprolol XL 50 mg daily ACE/ARB/ARNI: losartan 25 mg daily MRA: spironolactone 12.5 mg daily SGLT2i: Jardiance 10 mg daily  Prior to admission HF Medications: None  Pertinent Lab Values: Serum creatinine 0.96, BUN 22, Potassium 4.7, Sodium 136, Magnesium 2.3  Vital Signs: Weight: 130 lbs (admission weight: 131 lbs) Blood pressure: 120-130/80s  Heart rate: 70s  I/O: net -1.2L  Medication Assistance / Insurance Benefits Check: Does the patient have prescription insurance?  Yes Type of insurance plan: Financial risk [employer] (out-of-network)  Outpatient Pharmacy:  Prior to admission outpatient pharmacy: [facility] Is the patient willing to use [facility] TOC pharmacy at discharge? Yes Is the patient willing to transition their outpatient pharmacy to utilize a [facility] outpatient pharmacy?   No - out of network insurance    Assessment: 1. 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 NYHA class II symptoms. - Off IV lasix. Strict I/Os and daily weights. Keep K>4 and Mg>2. - Continue metoprolol XL 50 mg daily - Continue losartan 25 mg daily, consider transitioning to Entresto 24/26 mg BID - Continue spironolactone 12.5 mg daily - Continue Jardiance 10 mg daily   Plan: 1)  Medication changes recommended at this time: - Stop losartan and start Entresto 24/26 mg BID  2) Patient assistance: [employer] plan is out of network. Copays are higher when billed under [facility] - Can use monthly copay cards for medications  3)  Education  - Patient has been educated on current HF medications and potential additions to HF medication regimen - Patient verbalizes understanding that over the next few months, these medication doses may change and more medications may be added to optimize HF regimen - Patient has been educated on basic disease state pathophysiology and goals of therapy   Sharen Hones, PharmD, BCPS Heart Failure Stewardship Pharmacist Phone [phone]

## 2023-03-15 NOTE — Progress Notes (Unsigned)
Cardiology Office Note:    Date:  03/18/2023   ID:  Delorse Limber, DOB 07/23/1956, MRN [id number]  PCP:  Patient, No Pcp Per  Cardiologist:  Chrystie Nose, MD  Electrophysiologist:  None   Referring MD: No ref. provider found   Chief Complaint  Patient presents with   Atrial Fibrillation    History of Present Illness:    Jennifer Pennington is a 67 y.o. female with a hx of nonobstructive CAD, multifocal atrial tachycardia, tobacco use, alcohol use, hyperlipidemia, atrial fibrillation, systolic heart failure who presents for follow-up.  She was admitted 02/2022 with chest pain.  Coronary CTA showed calcium score 1211 (99th percentile), 25 to 49% diffuse RCA disease with negative FFR, 75 to 99% distal OM stenosis (too small for PCI), mild stenosis in ostial left main.  Medical therapy was recommended.  She was discharged on metoprolol 25 mg twice daily, as was noted to have multifocal atrial tachycardia as well as runs of SVT during admission.  Echocardiogram at that time showed EF 55 to 60% with lateral hypokinesis, grade 3 diastolic dysfunction, mild mitral regurgitation.  She was lost to follow-up, had been off her medications for months when she presented to the ED 02/2023 with shortness of breath and swelling in legs.  Found to be in A-fib with RVR, heart rates up to 160s on admission.  Echocardiogram 03/09/2023 showed EF 40 to 45%, mild RV systolic dysfunction, moderate left atrial enlargement, mild right atrial enlargement, no significant valvular disease.  She underwent successful TEE/DCCV on 03/11/2023, TEE showed EF 30 to 35%, normal RV function, moderate biatrial enlargement, no significant valvular disease.  Since discharge from the hospital, she reports she is doing okay.  States that dyspnea has improved, denies any chest pain.  Denies any lightheadedness, syncope, lower extremity edema, or palpitations.  Weight has been stable when she checks at home.  She is taking Eliquis, denies any  bleeding issues.   Wt Readings from Last 3 Encounters:  03/17/23 133 lb 3.2 oz (60.4 kg)  03/12/23 130 lb 4.8 oz (59.1 kg)  03/11/22 124 lb 5.4 oz (56.4 kg)     Past Medical History:  Diagnosis Date   Alcohol abuse    Arthritis    Elevated LFTs    Gout    Hyperlipidemia    Multifocal atrial tachycardia    Non-obstructive CAD    a. Coronary CTA 02/2022:Coronary calcium score of 1,211 (99th percentile for age and sex) with diffuse calcified and noncalcified plaque (25-49%), distal small branch of OM disease (75-99%) and a vessel that of peers too small for PCI, and mild calcified plaque of the ostial left main with no stenosis. FFR was sent and showed no flow-limiting CAD.  Therefore, medical therapy was recommended.   Paroxysmal SVT (supraventricular tachycardia)    Tobacco abuse     Past Surgical History:  Procedure Laterality Date   CARDIOVERSION N/A 03/11/2023   Procedure: CARDIOVERSION;  Surgeon: Chilton Si, MD;  Location: [facility] INVASIVE CV LAB;  Service: Cardiovascular;  Laterality: N/A;   TEE WITHOUT CARDIOVERSION N/A 03/11/2023   Procedure: TRANSESOPHAGEAL ECHOCARDIOGRAM;  Surgeon: Chilton Si, MD;  Location: [facility] INVASIVE CV LAB;  Service: Cardiovascular;  Laterality: N/A;   TUBAL LIGATION      Current Medications: Current Meds  Medication Sig   acetaminophen (TYLENOL) 325 MG tablet Take 650 mg by mouth every 6 (six) hours as needed for mild pain, moderate pain or headache.   amiodarone (PACERONE) 200 MG tablet Take 200  mg two times daily for 14 days, then decrease to 200 mg daily   apixaban (ELIQUIS) 5 MG TABS tablet Take 1 tablet (5 mg total) by mouth 2 (two) times daily.   atorvastatin (LIPITOR) 40 MG tablet Take 1 tablet (40 mg total) by mouth daily. Please call to schedule an overdue appointment with cardiology for refills, [phone]   empagliflozin (JARDIANCE) 10 MG TABS tablet Take 1 tablet (10 mg total) by mouth daily.   FLUoxetine (PROZAC) 10 MG  capsule Take 1 capsule (10 mg total) by mouth daily.   furosemide (LASIX) 40 MG tablet Take 1 tablet (40 mg total) by mouth daily as needed for fluid or edema (swelling of legs, weight gain >3 lbs in one day).   losartan (COZAAR) 25 MG tablet Take 1 tablet (25 mg total) by mouth daily.   metoprolol succinate (TOPROL-XL) 50 MG 24 hr tablet Take 1 tablet (50 mg total) by mouth daily. Take with or immediately following a meal.   nicotine (NICODERM CQ - DOSED IN MG/24 HOURS) 14 mg/24hr patch Place 1 patch (14 mg total) onto the skin daily.   spironolactone (ALDACTONE) 25 MG tablet Take 1/2 tablet (12.5 mg total) by mouth daily.     Allergies:   Flagyl [metronidazole]   Social History   Socioeconomic History   Marital status: Married    Spouse name: Not on file   Number of children: Not on file   Years of education: Not on file   Highest education level: Not on file  Occupational History   Not on file  Tobacco Use   Smoking status: Every Day    Packs/day: 1    Types: Cigarettes   Smokeless tobacco: Never  Substance and Sexual Activity   Alcohol use: Yes    Alcohol/week: 35.0 standard drinks of alcohol    Types: 35 Cans of beer per week   Drug use: Never   Sexual activity: Not on file  Other Topics Concern   Not on file  Social History Narrative   Not on file   Social Determinants of Health   Financial Resource Strain: Not on file  Food Insecurity: No Food Insecurity (03/09/2023)   Hunger Vital Sign    Worried About Running Out of Food in the Last Year: Never true    Ran Out of Food in the Last Year: Never true  Transportation Needs: No Transportation Needs (03/09/2023)   PRAPARE - Administrator, Civil Service (Medical): No    Lack of Transportation (Non-Medical): No  Physical Activity: Not on file  Stress: Not on file  Social Connections: Not on file     Family History: The patient's family history includes Heart disease in her father.  ROS:   Please see  the history of present illness.     All other systems reviewed and are negative.  EKGs/Labs/Other Studies Reviewed:    The following studies were reviewed today:   EKG:   03/17/23: Afib, rate 102  Recent Labs: 03/08/2023: ALT 33; TSH 2.183 03/11/2023: Hemoglobin 14.6; Platelets 292 03/17/2023: BUN 10; Creatinine, Ser 0.71; Magnesium 2.2; Potassium 5.6; Sodium 130  Recent Lipid Panel    Component Value Date/Time   CHOL 183 03/11/2022 0741   TRIG 70 03/11/2022 0741   HDL 67 03/11/2022 0741   CHOLHDL 2.7 03/11/2022 0741   VLDL 14 03/11/2022 0741   LDLCALC 102 (H) 03/11/2022 0741   LDLDIRECT 154.0 01/08/2010 0931    Physical Exam:    VS:  BP (!) 125/91   Pulse (!) 102   Ht 5' (1.524 m)   Wt 133 lb 3.2 oz (60.4 kg)   SpO2 93%   BMI 26.01 kg/m     Wt Readings from Last 3 Encounters:  03/17/23 133 lb 3.2 oz (60.4 kg)  03/12/23 130 lb 4.8 oz (59.1 kg)  03/11/22 124 lb 5.4 oz (56.4 kg)     GEN:  Well nourished, well developed in no acute distress HEENT: Normal NECK: No JVD; No carotid bruits LYMPHATICS: No lymphadenopathy CARDIAC: irregular, tachycardic,  no murmurs, rubs, gallops RESPIRATORY:  Clear to auscultation without rales, wheezing or rhonchi  ABDOMEN: Soft, non-tender, non-distended MUSCULOSKELETAL:  No edema; No deformity  SKIN: Warm and dry NEUROLOGIC:  Alert and oriented x 3 PSYCHIATRIC:  Normal affect   ASSESSMENT:    1. Persistent atrial fibrillation (HCC)   2. Acute systolic heart failure (HCC)   3. Essential hypertension   4. Hyperlipidemia, unspecified hyperlipidemia type   5. Coronary artery disease involving native coronary artery of native heart without angina pectoris   6. Tobacco use    PLAN:    Atrial fibrillation: Was noted to be in new onset atrial fibrillation with RVR with a heart rate in the 160s on admission/2024.  Echo shows EF 40-45%, mild RV dysfunction, no significant valve disease.  Status post successful TEE/DCCV 03/11/2023.  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  She is back in A-fib.  Will start amiodarone 200 mg twice daily x 2 weeks then reduce to 200 mg daily.  Plan follow-up in 2 weeks and if still in A-fib, will plan for cardioversion.  Would not plan on long-term amiodarone use given her age, will refer to EP to consider ablation.  Acute HFrEF: Echo 02/2022 with preserved LVEF, grade 3 diastolic dysfunction.  BNP 383, chest x-ray with small effusion.  Echo 02/2023 shows EF 40 to 45%.  -- Appears euvolemic, continue Lasix 40 mg daily as needed.  Would monitor daily weights and can take Lasix if gains more than 3 pounds in 1 day or 5 pounds in 1 -- Continue Toprol-XL 50 mg daily -- Continue losartan 25 mg daily -- Continue spironolactone 12.5 mg daily -- Continue Jardiance 10 mg daily -- Check BMET   Hypertension: Continue metoprolol 50 mg twice daily, losartan 25 mg daily, spironolactone 12.5 mg daily   Hyperlipidemia: Continue atorvastatin 40 mg daily    CAD: Coronary CTA 03/2022 showed calcium score 1211 (99th percentile), 25 to 49% diffuse RCA disease with negative FFR, 75 to 99% distal OM stenosis (too small for PCI), mild stenosis in ostial left main.  Medical therapy was recommended. -Continue Eliquis 5 mg twice daily -Continue atorvastatin 40 mg daily  Tobacco use: Reports she has quit smoking, congratulated patient on quitting and encouraged continued cessation  RTC in 2 weeks  Medication Adjustments/Labs and Tests Ordered: Current medicines are reviewed at length with the patient today.  Concerns regarding medicines are outlined above.  Orders Placed This Encounter  Procedures   Basic metabolic panel   Magnesium   Ambulatory referral to Cardiac Electrophysiology   EKG 12-Lead   Meds ordered this encounter  Medications   amiodarone (PACERONE) 200  MG tablet    Sig: Take 200 mg two times daily for 14 days, then decrease to 200 mg daily    Dispense:  42 tablet  Refill:  0    Patient Instructions  Medication Instructions:  START amiodarone 200 mg two times daily for 2 weeks, then decrease to 200 mg daily  *If you need a refill on your cardiac medications before your next appointment, please call your pharmacy*   Lab Work: BMET, Mag today  If you have labs (blood work) drawn today and your tests are completely normal, you will receive your results only by: MyChart Message (if you have MyChart) OR A paper copy in the mail If you have any lab test that is abnormal or we need to change your treatment, we will call you to review the results.   Testing/Procedures: Your physician has recommended that you have a Cardioversion (DCCV). Electrical Cardioversion uses a jolt of electricity to your heart either through paddles or wired patches attached to your chest. This is a controlled, usually prescheduled, procedure. Defibrillation is done under light anesthesia in the hospital, and you usually go home the day of the procedure. This is done to get your heart back into a normal rhythm. You are not awake for the procedure. Please see the instruction sheet given to you today.  Follow-Up: At [facility], you and your health needs are our priority.  As part of our continuing mission to provide you with exceptional heart care, we have created designated Provider Care Teams.  These Care Teams include your primary Cardiologist (physician) and Advanced Practice Providers (APPs -  Physician Assistants and Nurse Practitioners) who all work together to provide you with the care you need, when you need it.  We recommend signing up for the patient portal called "MyChart".  Sign up information is provided on this After Visit Summary.  MyChart is used to connect with patients for Virtual Visits (Telemedicine).  Patients are able to view lab/test  results, encounter notes, upcoming appointments, etc.  Non-urgent messages can be sent to your provider as well.   To learn more about what you can do with MyChart, go to [email or web address].    Your next appointment:   5/20 at 1pm with Jennifer Pennington   Other Instructions You have been referred to: Electrophysiology     Signed, Jennifer Ishikawa, MD  03/18/2023 9:52 AM    [facility]

## 2023-03-17 ENCOUNTER — Ambulatory Visit (INDEPENDENT_AMBULATORY_CARE_PROVIDER_SITE_OTHER): Payer: BLUE CROSS/BLUE SHIELD | Admitting: Cardiology

## 2023-03-17 ENCOUNTER — Encounter: Payer: Self-pay | Admitting: Cardiology

## 2023-03-17 VITALS — BP 125/91 | HR 102 | Ht 60.0 in | Wt 133.2 lb

## 2023-03-17 DIAGNOSIS — I5021 Acute systolic (congestive) heart failure: Secondary | ICD-10-CM

## 2023-03-17 DIAGNOSIS — I4819 Other persistent atrial fibrillation: Secondary | ICD-10-CM | POA: Diagnosis not present

## 2023-03-17 DIAGNOSIS — E785 Hyperlipidemia, unspecified: Secondary | ICD-10-CM

## 2023-03-17 DIAGNOSIS — Z720 Tobacco use: Secondary | ICD-10-CM

## 2023-03-17 DIAGNOSIS — I2510 Atherosclerotic heart disease of native coronary artery without angina pectoris: Secondary | ICD-10-CM | POA: Diagnosis not present

## 2023-03-17 DIAGNOSIS — I10 Essential (primary) hypertension: Secondary | ICD-10-CM | POA: Diagnosis not present

## 2023-03-17 LAB — BASIC METABOLIC PANEL
BUN/Creatinine Ratio: 14 (ref 12–28)
BUN: 10 mg/dL (ref 8–27)
CO2: 27 mmol/L (ref 20–29)
Calcium: 9.3 mg/dL (ref 8.7–10.3)
Chloride: 92 mmol/L — ABNORMAL LOW (ref 96–106)
Creatinine, Ser: 0.71 mg/dL (ref 0.57–1.00)
Glucose: 79 mg/dL (ref 70–99)
Potassium: 5.6 mmol/L — ABNORMAL HIGH (ref 3.5–5.2)
Sodium: 130 mmol/L — ABNORMAL LOW (ref 134–144)
eGFR: 95 mL/min/{1.73_m2} (ref >59)

## 2023-03-17 LAB — MAGNESIUM: Magnesium: 2.2 mg/dL (ref 1.6–2.3)

## 2023-03-17 MED ORDER — AMIODARONE HCL 200 MG PO TABS: ORAL_TABLET | ORAL | 0 refills | Status: DC

## 2023-03-17 NOTE — Patient Instructions (Signed)
Medication Instructions:  START amiodarone 200 mg two times daily for 2 weeks, then decrease to 200 mg daily  *If you need a refill on your cardiac medications before your next appointment, please call your pharmacy*   Lab Work: BMET, Mag today  If you have labs (blood work) drawn today and your tests are completely normal, you will receive your results only by: MyChart Message (if you have MyChart) OR A paper copy in the mail If you have any lab test that is abnormal or we need to change your treatment, we will call you to review the results.   Testing/Procedures: Your physician has recommended that you have a Cardioversion (DCCV). Electrical Cardioversion uses a jolt of electricity to your heart either through paddles or wired patches attached to your chest. This is a controlled, usually prescheduled, procedure. Defibrillation is done under light anesthesia in the hospital, and you usually go home the day of the procedure. This is done to get your heart back into a normal rhythm. You are not awake for the procedure. Please see the instruction sheet given to you today.  Follow-Up: At [facility], you and your health needs are our priority.  As part of our continuing mission to provide you with exceptional heart care, we have created designated Provider Care Teams.  These Care Teams include your primary Cardiologist (physician) and Advanced Practice Providers (APPs -  Physician Assistants and Nurse Practitioners) who all work together to provide you with the care you need, when you need it.  We recommend signing up for the patient portal called "MyChart".  Sign up information is provided on this After Visit Summary.  MyChart is used to connect with patients for Virtual Visits (Telemedicine).  Patients are able to view lab/test results, encounter notes, upcoming appointments, etc.  Non-urgent messages can be sent to your provider as well.   To learn more about what you can do with  MyChart, go to [email or web address].    Your next appointment:   5/20 at 1pm with Dr. Bjorn Pippin   Other Instructions You have been referred to: Electrophysiology

## 2023-03-19 ENCOUNTER — Other Ambulatory Visit: Payer: Self-pay | Admitting: *Deleted

## 2023-03-19 DIAGNOSIS — E875 Hyperkalemia: Secondary | ICD-10-CM

## 2023-03-19 MED ORDER — AMIODARONE HCL 200 MG PO TABS: 200.0000 mg | ORAL_TABLET | Freq: Every day | ORAL | Status: DC

## 2023-03-27 NOTE — Progress Notes (Signed)
HEART & VASCULAR TRANSITION OF CARE CONSULT NOTE     Referring Physician: Primary Care: Primary Cardiologist:  HPI: Referred to clinic by *** for heart failure consultation.   Jennifer Pennington is a 67 y.o. female with a hx of gout, tobacco use, ETOH use, nonobstructive CAD, MAT, HLD, and new diagnosis of AF and HFmrEF.  Admitted 4/23 with CP and palpitations. Found to have multifocal AT, started on dilt gtt and eventually transitioned to metoprolol. Echo showed EF 55-60%, lateral hypokinesis and grade 3 D. Coronary CTA showed elevated coronary calcium score of 1211 which was noted at percentile with diffuse calcified and noncalcified plaque with distal branch of OM being 75 to 99% which appeared to be too small for PCI with mild calcified plaque in the ostial left main with no stenosis. FFR was negative. Medical therapy was recommended and she was discharged home.  She unfortunately was lost to follow-up.   Admitted 4/24 with dyspnea. Found to have new AF with RVR. She was started on diltiazem drip. Echo showed EF 40-45%, moderate LVH, RV mildly reduced. Underwent successful TEE/DCCV on 03/11/2023 to [facility]. TEE showed EF 30 to 35%, normal RV, moderate biatrial enlargement, no significant valvular disease. Drips weaned, GDMT titrated and she was discharged home, weight 130 lbs.  Today she presents to [facility] for post hospital follow up. Overall feeling fine. Denies increasing SOB, CP, dizziness, edema, or PND/Orthopnea. Appetite ok. No fever or chills. Weight at home 170 pounds. Taking all medications.    Cardiac Testing    Review of Systems: [y] = yes, [ ]  = no   General: Weight gain [ ] ; Weight loss [ ] ; Anorexia [ ] ; Fatigue [ ] ; Fever [ ] ; Chills [ ] ; Weakness [ ]   Cardiac: Chest pain/pressure [ ] ; Resting SOB [ ] ; Exertional SOB [ ] ; Orthopnea [ ] ; Pedal Edema [ ] ; Palpitations [ ] ; Syncope [ ] ; Presyncope [ ] ; Paroxysmal nocturnal dyspnea[ ]   Pulmonary: Cough [ ] ; Wheezing[ ] ;  Hemoptysis[ ] ; Sputum [ ] ; Snoring [ ]   GI: Vomiting[ ] ; Dysphagia[ ] ; Melena[ ] ; Hematochezia [ ] ; Heartburn[ ] ; Abdominal pain [ ] ; Constipation [ ] ; Diarrhea [ ] ; BRBPR [ ]   GU: Hematuria[ ] ; Dysuria [ ] ; Nocturia[ ]   Vascular: Pain in legs with walking [ ] ; Pain in feet with lying flat [ ] ; Non-healing sores [ ] ; Stroke [ ] ; TIA [ ] ; Slurred speech [ ] ;  Neuro: Headaches[ ] ; Vertigo[ ] ; Seizures[ ] ; Paresthesias[ ] ;Blurred vision [ ] ; Diplopia [ ] ; Vision changes [ ]   Ortho/Skin: Arthritis [ ] ; Joint pain [ ] ; Muscle pain [ ] ; Joint swelling [ ] ; Back Pain [ ] ; Rash [ ]   Psych: Depression[ ] ; Anxiety[ ]   Heme: Bleeding problems [ ] ; Clotting disorders [ ] ; Anemia [ ]   Endocrine: Diabetes [ ] ; Thyroid dysfunction[ ]    Past Medical History:  Diagnosis Date   Alcohol abuse    Arthritis    Elevated LFTs    Gout    Hyperlipidemia    Multifocal atrial tachycardia    Non-obstructive CAD    a. Coronary CTA 02/2022:Coronary calcium score of 1,211 (99th percentile for age and sex) with diffuse calcified and noncalcified plaque (25-49%), distal small branch of OM disease (75-99%) and a vessel that of peers too small for PCI, and mild calcified plaque of the ostial left main with no stenosis. FFR was sent and showed no flow-limiting CAD.  Therefore, medical therapy was recommended.   Paroxysmal SVT (supraventricular  tachycardia)    Tobacco abuse     Current Outpatient Medications  Medication Sig Dispense Refill   acetaminophen (TYLENOL) 325 MG tablet Take 650 mg by mouth every 6 (six) hours as needed for mild pain, moderate pain or headache.     amiodarone (PACERONE) 200 MG tablet Take 1 tablet (200 mg total) by mouth daily.     apixaban (ELIQUIS) 5 MG TABS tablet Take 1 tablet (5 mg total) by mouth 2 (two) times daily. 60 tablet 0   atorvastatin (LIPITOR) 40 MG tablet Take 1 tablet (40 mg total) by mouth daily. Please call to schedule an overdue appointment with cardiology for refills,  [phone] 30 tablet 0   empagliflozin (JARDIANCE) 10 MG TABS tablet Take 1 tablet (10 mg total) by mouth daily. 30 tablet 0   FLUoxetine (PROZAC) 10 MG capsule Take 1 capsule (10 mg total) by mouth daily. 30 capsule 0   furosemide (LASIX) 40 MG tablet Take 1 tablet (40 mg total) by mouth daily as needed for fluid or edema (swelling of legs, weight gain >3 lbs in one day). 30 tablet 11   losartan (COZAAR) 25 MG tablet Take 1 tablet (25 mg total) by mouth daily. 30 tablet 0   metoprolol succinate (TOPROL-XL) 50 MG 24 hr tablet Take 1 tablet (50 mg total) by mouth daily. Take with or immediately following a meal. 30 tablet 0   nicotine (NICODERM CQ - DOSED IN MG/24 HOURS) 14 mg/24hr patch Place 1 patch (14 mg total) onto the skin daily. 28 patch 0   spironolactone (ALDACTONE) 25 MG tablet Take 1/2 tablet (12.5 mg total) by mouth daily. 15 tablet 0   No current facility-administered medications for this visit.    Allergies  Allergen Reactions   Flagyl [Metronidazole] Diarrhea and Nausea And Vomiting      Social History   Socioeconomic History   Marital status: Married    Spouse name: Not on file   Number of children: Not on file   Years of education: Not on file   Highest education level: Not on file  Occupational History   Not on file  Tobacco Use   Smoking status: Every Day    Packs/day: 1    Types: Cigarettes   Smokeless tobacco: Never  Substance and Sexual Activity   Alcohol use: Yes    Alcohol/week: 35.0 standard drinks of alcohol    Types: 35 Cans of beer per week   Drug use: Never   Sexual activity: Not on file  Other Topics Concern   Not on file  Social History Narrative   Not on file   Social Determinants of Health   Financial Resource Strain: Not on file  Food Insecurity: No Food Insecurity (03/09/2023)   Hunger Vital Sign    Worried About Running Out of Food in the Last Year: Never true    Ran Out of Food in the Last Year: Never true  Transportation Needs:  No Transportation Needs (03/09/2023)   PRAPARE - Administrator, Civil Service (Medical): No    Lack of Transportation (Non-Medical): No  Physical Activity: Not on file  Stress: Not on file  Social Connections: Not on file  Intimate Partner Violence: Not At Risk (03/09/2023)   Humiliation, Afraid, Rape, and Kick questionnaire    Fear of Current or Ex-Partner: No    Emotionally Abused: No    Physically Abused: No    Sexually Abused: No      Family History  Problem Relation Age of Onset   Heart disease Father     There were no vitals filed for this visit.  PHYSICAL EXAM: General:  NAD. No resp difficulty HEENT: Normal Neck: Supple. No JVD. Carotids 2+ bilat; no bruits. No lymphadenopathy or thryomegaly appreciated. Cor: PMI nondisplaced. Regular rate & rhythm. No rubs, gallops or murmurs. Lungs: Clear Abdomen: Soft, nontender, nondistended. No hepatosplenomegaly. No bruits or masses. Good bowel sounds. Extremities: No cyanosis, clubbing, rash, edema Neuro: Alert & oriented x 3, cranial nerves grossly intact. Moves all 4 extremities w/o difficulty. Affect pleasant.  ECG:  ASSESSMENT & PLAN:  Chronic Systolic Heart Failure  AF  CAD  HTN  HLD  Tobacco abuse   NYHA *** GDMT  Diuretic- BB- Ace/ARB/ARNI MRA SGLT2i   Referred to HFSW (PCP, Medications, Transportation, ETOH Abuse, Drug Abuse, Insurance, Financial ): Yes or No Refer to Pharmacy: Yes or No Refer to Home Health: Yes on No Refer to Advanced Heart Failure Clinic: Yes or no  Refer to General Cardiology: Yes or No  Follow up

## 2023-03-30 ENCOUNTER — Encounter (HOSPITAL_COMMUNITY): Payer: Self-pay

## 2023-03-30 ENCOUNTER — Ambulatory Visit (HOSPITAL_COMMUNITY)
Admit: 2023-03-30 | Discharge: 2023-03-30 | Disposition: A | Discharge status: Home / Self Care | Payer: BLUE CROSS/BLUE SHIELD | Attending: Family Medicine | Admitting: Family Medicine

## 2023-03-30 ENCOUNTER — Encounter: Payer: Self-pay | Admitting: Cardiology

## 2023-03-30 VITALS — BP 142/90 | HR 59 | Wt 131.4 lb

## 2023-03-30 DIAGNOSIS — Z79899 Other long term (current) drug therapy: Secondary | ICD-10-CM | POA: Diagnosis not present

## 2023-03-30 DIAGNOSIS — I5022 Chronic systolic (congestive) heart failure: Secondary | ICD-10-CM | POA: Diagnosis not present

## 2023-03-30 DIAGNOSIS — Z720 Tobacco use: Secondary | ICD-10-CM | POA: Diagnosis not present

## 2023-03-30 DIAGNOSIS — I480 Paroxysmal atrial fibrillation: Secondary | ICD-10-CM

## 2023-03-30 DIAGNOSIS — E785 Hyperlipidemia, unspecified: Secondary | ICD-10-CM | POA: Diagnosis not present

## 2023-03-30 DIAGNOSIS — I10 Essential (primary) hypertension: Secondary | ICD-10-CM

## 2023-03-30 DIAGNOSIS — Z7901 Long term (current) use of anticoagulants: Secondary | ICD-10-CM | POA: Insufficient documentation

## 2023-03-30 DIAGNOSIS — R002 Palpitations: Secondary | ICD-10-CM | POA: Diagnosis not present

## 2023-03-30 DIAGNOSIS — I110 Hypertensive heart disease with heart failure: Secondary | ICD-10-CM | POA: Insufficient documentation

## 2023-03-30 DIAGNOSIS — R079 Chest pain, unspecified: Secondary | ICD-10-CM | POA: Insufficient documentation

## 2023-03-30 DIAGNOSIS — E875 Hyperkalemia: Secondary | ICD-10-CM | POA: Insufficient documentation

## 2023-03-30 DIAGNOSIS — Z7984 Long term (current) use of oral hypoglycemic drugs: Secondary | ICD-10-CM | POA: Insufficient documentation

## 2023-03-30 DIAGNOSIS — I2510 Atherosclerotic heart disease of native coronary artery without angina pectoris: Secondary | ICD-10-CM | POA: Insufficient documentation

## 2023-03-30 LAB — BASIC METABOLIC PANEL
Anion gap: 10 (ref 5–15)
BUN: 11 mg/dL (ref 8–23)
CO2: 32 mmol/L (ref 22–32)
Calcium: 9.2 mg/dL (ref 8.9–10.3)
Chloride: 87 mmol/L — ABNORMAL LOW (ref 98–111)
Creatinine, Ser: 0.86 mg/dL (ref 0.44–1.00)
GFR, Estimated: >60 mL/min (ref >60)
Glucose, Bld: 88 mg/dL (ref 70–99)
Potassium: 3.7 mmol/L (ref 3.5–5.1)
Sodium: 129 mmol/L — ABNORMAL LOW (ref 135–145)

## 2023-03-30 MED ORDER — AMIODARONE HCL 200 MG PO TABS
200.0000 mg | ORAL_TABLET | Freq: Every day | ORAL | 6 refills | Status: DC
Start: 2023-03-30 — End: 2023-10-19

## 2023-03-30 MED ORDER — METOPROLOL SUCCINATE ER 50 MG PO TB24
50.0000 mg | ORAL_TABLET | Freq: Every day | ORAL | 6 refills | Status: DC
Start: 2023-03-30 — End: 2023-09-28

## 2023-03-30 MED ORDER — ATORVASTATIN CALCIUM 40 MG PO TABS
40.0000 mg | ORAL_TABLET | Freq: Every day | ORAL | 3 refills | Status: AC
Start: 2023-03-30 — End: ?

## 2023-03-30 MED ORDER — FUROSEMIDE 40 MG PO TABS
40.0000 mg | ORAL_TABLET | Freq: Every day | ORAL | 3 refills | Status: AC | PRN
Start: 2023-03-30 — End: ?

## 2023-03-30 MED ORDER — APIXABAN 5 MG PO TABS
5.0000 mg | ORAL_TABLET | Freq: Two times a day (BID) | ORAL | 6 refills | Status: DC
Start: 2023-03-30 — End: 2023-11-26

## 2023-03-30 MED ORDER — EMPAGLIFLOZIN 10 MG PO TABS
10.0000 mg | ORAL_TABLET | Freq: Every day | ORAL | 6 refills | Status: DC
Start: 2023-03-30 — End: 2023-11-06

## 2023-03-30 NOTE — Patient Instructions (Signed)
No change in medications. Labs drawn today - will call you if abnormal. Will send refills to your local pharmacy. Keep your scheduled follow up with Cardiology. Please call us at [phone] if any questions or concerns.

## 2023-03-31 ENCOUNTER — Encounter (HOSPITAL_COMMUNITY): Payer: Self-pay

## 2023-04-02 ENCOUNTER — Telehealth: Payer: Self-pay | Admitting: Cardiology

## 2023-04-02 ENCOUNTER — Encounter (HOSPITAL_COMMUNITY): Payer: Self-pay

## 2023-04-02 NOTE — Telephone Encounter (Signed)
Left voicemail for patient to return call to ofice

## 2023-04-02 NOTE — Telephone Encounter (Signed)
Patient was told the cardioversion and appt for 05/20 was going to be cancelled because her heart is back in rhythm. Patient got a text that her appts was not cancelled. Patient wanted to get those appts cancelled, but patient wanted to know when she should have her follow up appt scheduled for. Please advise.

## 2023-04-03 NOTE — Telephone Encounter (Signed)
LVM to return call to office

## 2023-04-06 ENCOUNTER — Ambulatory Visit: Payer: BLUE CROSS/BLUE SHIELD | Admitting: Cardiology

## 2023-04-06 ENCOUNTER — Telehealth: Payer: Self-pay

## 2023-04-06 ENCOUNTER — Other Ambulatory Visit (HOSPITAL_COMMUNITY): Payer: Self-pay

## 2023-04-06 NOTE — Telephone Encounter (Signed)
I left the patient a message to call the office back.  I called the patient to let her know that I checked with Dr. Judye Bos nurse at the patient's request and was told that the fluoxetine refill would need to come from the patient's primary care provider.

## 2023-04-06 NOTE — Telephone Encounter (Signed)
-----   Message from Harvel Ricks, RN sent at 04/06/2023  3:11 PM EDT ----- Regarding: RE: Generic Prozac We do not fill her Prozac, that will need to be her PCP.  Thanks! ----- Message ----- From: Mariam Dollar, CMA Sent: 04/06/2023   3:07 PM EDT To: Harvel Ricks, RN Subject: Generic Prozac                                 Hello,   The patient called and said that she needed a refill on her fluoxetine medication and that she needed to request the refill from Dr. Darryl Nestle.  The patient's contact number is [phone].  Thank you.

## 2023-04-07 NOTE — Telephone Encounter (Signed)
Left message to call back  DCCV scheduled 5/23 OV 5/20-patient cancelled OV HF TOC clinic 5/13-patient in NSR Per Dr. Bjorn Pippin, ok to cancel DCCV and r/s OV.   Cardioversion cancelled.

## 2023-04-08 NOTE — Telephone Encounter (Signed)
Attempt to contact patient, left message to call back

## 2023-04-08 NOTE — Telephone Encounter (Signed)
Call to patient and straight to VM.  LM that her cardioversion was cancelled and to please call the office to let us know she received this message

## 2023-04-09 ENCOUNTER — Ambulatory Visit (HOSPITAL_COMMUNITY): Admission: RE | Admit: 2023-04-09 | Payer: BLUE CROSS/BLUE SHIELD | Source: Home / Self Care | Admitting: Cardiology

## 2023-04-09 ENCOUNTER — Encounter (HOSPITAL_COMMUNITY): Admission: RE | Payer: Self-pay | Source: Home / Self Care

## 2023-04-09 SURGERY — CARDIOVERSION
Anesthesia: Monitor Anesthesia Care

## 2023-04-14 NOTE — Telephone Encounter (Signed)
Left message to call back to scheduled follow up appt .

## 2023-05-08 ENCOUNTER — Encounter: Payer: Self-pay | Admitting: Cardiology

## 2023-05-08 ENCOUNTER — Ambulatory Visit: Payer: BLUE CROSS/BLUE SHIELD | Attending: Cardiology | Admitting: Cardiology

## 2023-06-02 ENCOUNTER — Encounter: Payer: Self-pay | Admitting: Cardiology

## 2023-09-28 ENCOUNTER — Other Ambulatory Visit (HOSPITAL_COMMUNITY): Payer: Self-pay | Admitting: Family Medicine

## 2023-09-28 DIAGNOSIS — I480 Paroxysmal atrial fibrillation: Secondary | ICD-10-CM

## 2023-09-28 DIAGNOSIS — I5022 Chronic systolic (congestive) heart failure: Secondary | ICD-10-CM

## 2023-10-16 ENCOUNTER — Other Ambulatory Visit (HOSPITAL_COMMUNITY): Payer: Self-pay | Admitting: Family Medicine

## 2023-10-16 DIAGNOSIS — I480 Paroxysmal atrial fibrillation: Secondary | ICD-10-CM

## 2023-10-16 DIAGNOSIS — I5022 Chronic systolic (congestive) heart failure: Secondary | ICD-10-CM

## 2023-11-06 ENCOUNTER — Other Ambulatory Visit (HOSPITAL_COMMUNITY): Payer: Self-pay | Admitting: Family Medicine

## 2023-11-06 DIAGNOSIS — I5022 Chronic systolic (congestive) heart failure: Secondary | ICD-10-CM

## 2023-11-06 DIAGNOSIS — I480 Paroxysmal atrial fibrillation: Secondary | ICD-10-CM

## 2023-11-26 ENCOUNTER — Other Ambulatory Visit (HOSPITAL_COMMUNITY): Payer: Self-pay | Admitting: Cardiology

## 2023-11-26 DIAGNOSIS — I5022 Chronic systolic (congestive) heart failure: Secondary | ICD-10-CM

## 2023-11-26 DIAGNOSIS — I480 Paroxysmal atrial fibrillation: Secondary | ICD-10-CM

## 2023-11-26 MED ORDER — APIXABAN 5 MG PO TABS: 5.0000 mg | ORAL_TABLET | Freq: Two times a day (BID) | ORAL | 0 refills | Status: AC
